# Patient Record
Sex: Female | Born: 1965 | Race: Black or African American | Hispanic: No | Marital: Married | Smoking: Never smoker
Health system: Southern US, Community
[De-identification: ages and names within clinical notes are randomized; demographics above are authoritative.]

## PROBLEM LIST (undated history)

## (undated) DIAGNOSIS — E119 Type 2 diabetes mellitus without complications: Secondary | ICD-10-CM

## (undated) DIAGNOSIS — T7840XA Allergy, unspecified, initial encounter: Secondary | ICD-10-CM

## (undated) DIAGNOSIS — I10 Essential (primary) hypertension: Secondary | ICD-10-CM

## (undated) DIAGNOSIS — E785 Hyperlipidemia, unspecified: Secondary | ICD-10-CM

## (undated) HISTORY — DX: Hyperlipidemia, unspecified: E78.5

## (undated) HISTORY — DX: Essential (primary) hypertension: I10

## (undated) HISTORY — DX: Allergy, unspecified, initial encounter: T78.40XA

## (undated) HISTORY — PX: REDUCTION MAMMAPLASTY: SUR839

## (undated) HISTORY — DX: Type 2 diabetes mellitus without complications: E11.9

---

## 2000-01-15 ENCOUNTER — Other Ambulatory Visit: Admission: RE | Admit: 2000-01-15 | Discharge: 2000-01-15 | Payer: Self-pay | Admitting: *Deleted

## 2000-05-02 ENCOUNTER — Ambulatory Visit (HOSPITAL_BASED_OUTPATIENT_CLINIC_OR_DEPARTMENT_OTHER): Admission: RE | Admit: 2000-05-02 | Discharge: 2000-05-03 | Payer: Self-pay | Admitting: Specialist

## 2000-12-15 ENCOUNTER — Encounter: Admission: RE | Admit: 2000-12-15 | Discharge: 2001-03-15 | Payer: Self-pay | Admitting: Internal Medicine

## 2003-10-19 HISTORY — PX: BREAST REDUCTION SURGERY: SHX8

## 2006-06-30 ENCOUNTER — Ambulatory Visit: Payer: Self-pay | Admitting: Nurse Practitioner

## 2006-07-01 ENCOUNTER — Ambulatory Visit: Payer: Self-pay | Admitting: *Deleted

## 2006-07-19 ENCOUNTER — Ambulatory Visit: Payer: Self-pay | Admitting: Nurse Practitioner

## 2006-07-26 ENCOUNTER — Ambulatory Visit (HOSPITAL_COMMUNITY): Admission: RE | Admit: 2006-07-26 | Discharge: 2006-07-26 | Payer: Self-pay | Admitting: Family Medicine

## 2006-08-03 ENCOUNTER — Ambulatory Visit (HOSPITAL_COMMUNITY): Admission: RE | Admit: 2006-08-03 | Discharge: 2006-08-03 | Payer: Self-pay | Admitting: Internal Medicine

## 2006-08-09 ENCOUNTER — Ambulatory Visit: Payer: Self-pay | Admitting: Nurse Practitioner

## 2006-11-21 ENCOUNTER — Ambulatory Visit: Payer: Self-pay | Admitting: Nurse Practitioner

## 2007-07-05 ENCOUNTER — Encounter (INDEPENDENT_AMBULATORY_CARE_PROVIDER_SITE_OTHER): Payer: Self-pay | Admitting: *Deleted

## 2010-11-08 ENCOUNTER — Encounter: Payer: Self-pay | Admitting: Family Medicine

## 2014-12-16 ENCOUNTER — Ambulatory Visit: Payer: Self-pay

## 2015-11-04 ENCOUNTER — Encounter: Payer: Self-pay | Admitting: Physician Assistant

## 2015-11-04 ENCOUNTER — Ambulatory Visit (INDEPENDENT_AMBULATORY_CARE_PROVIDER_SITE_OTHER): Payer: BLUE CROSS/BLUE SHIELD | Admitting: Physician Assistant

## 2015-11-04 VITALS — BP 142/71 | HR 87 | Ht 64.0 in | Wt 214.0 lb

## 2015-11-04 DIAGNOSIS — E1165 Type 2 diabetes mellitus with hyperglycemia: Secondary | ICD-10-CM | POA: Diagnosis not present

## 2015-11-04 DIAGNOSIS — R631 Polydipsia: Secondary | ICD-10-CM | POA: Diagnosis not present

## 2015-11-04 DIAGNOSIS — Z79899 Other long term (current) drug therapy: Secondary | ICD-10-CM

## 2015-11-04 DIAGNOSIS — L659 Nonscarring hair loss, unspecified: Secondary | ICD-10-CM

## 2015-11-04 DIAGNOSIS — R5383 Other fatigue: Secondary | ICD-10-CM

## 2015-11-04 DIAGNOSIS — Z0000 Encounter for general adult medical examination without abnormal findings: Secondary | ICD-10-CM

## 2015-11-04 DIAGNOSIS — Z23 Encounter for immunization: Secondary | ICD-10-CM | POA: Diagnosis not present

## 2015-11-04 DIAGNOSIS — E669 Obesity, unspecified: Secondary | ICD-10-CM | POA: Diagnosis not present

## 2015-11-04 DIAGNOSIS — Z1211 Encounter for screening for malignant neoplasm of colon: Secondary | ICD-10-CM

## 2015-11-04 DIAGNOSIS — Z13220 Encounter for screening for lipoid disorders: Secondary | ICD-10-CM | POA: Diagnosis not present

## 2015-11-04 DIAGNOSIS — Z1239 Encounter for other screening for malignant neoplasm of breast: Secondary | ICD-10-CM

## 2015-11-04 DIAGNOSIS — IMO0001 Reserved for inherently not codable concepts without codable children: Secondary | ICD-10-CM

## 2015-11-04 LAB — POCT GLYCOSYLATED HEMOGLOBIN (HGB A1C): Hemoglobin A1C: >14

## 2015-11-04 MED ORDER — METFORMIN HCL 1000 MG PO TABS: 1000.0000 mg | ORAL_TABLET | Freq: Two times a day (BID) | ORAL | Status: DC

## 2015-11-04 MED ORDER — INSULIN GLARGINE 300 UNIT/ML ~~LOC~~ SOPN: 10.0000 mg | PEN_INJECTOR | Freq: Every day | SUBCUTANEOUS | Status: DC

## 2015-11-04 MED ORDER — LIRAGLUTIDE 18 MG/3ML ~~LOC~~ SOPN: 1.8000 mg | PEN_INJECTOR | Freq: Every day | SUBCUTANEOUS | Status: DC

## 2015-11-04 MED ORDER — AMBULATORY NON FORMULARY MEDICATION: Status: DC

## 2015-11-04 MED ORDER — LISINOPRIL 20 MG PO TABS: 20.0000 mg | ORAL_TABLET | Freq: Every day | ORAL | Status: DC

## 2015-11-04 NOTE — Patient Instructions (Signed)
Diabetes and Exercise Exercising regularly is important. It is not just about losing weight. It has many health benefits, such as:  Improving your overall fitness, flexibility, and endurance.  Increasing your bone density.  Helping with weight control.  Decreasing your body fat.  Increasing your muscle strength.  Reducing stress and tension.  Improving your overall health. People with diabetes who exercise gain additional benefits because exercise:  Reduces appetite.  Improves the body's use of blood sugar (glucose).  Helps lower or control blood glucose.  Decreases blood pressure.  Helps control blood lipids (such as cholesterol and triglycerides).  Improves the body's use of the hormone insulin by:  Increasing the body's insulin sensitivity.  Reducing the body's insulin needs.  Decreases the risk for heart disease because exercising:  Lowers cholesterol and triglycerides levels.  Increases the levels of good cholesterol (such as high-density lipoproteins [HDL]) in the body.  Lowers blood glucose levels. YOUR ACTIVITY PLAN  Choose an activity that you enjoy, and set realistic goals. To exercise safely, you should begin practicing any new physical activity slowly, and gradually increase the intensity of the exercise over time. Your health care provider or diabetes educator can help create an activity plan that works for you. General recommendations include:  Encouraging children to engage in at least 60 minutes of physical activity each day.  Stretching and performing strength training exercises, such as yoga or weight lifting, at least 2 times per week.  Performing a total of at least 150 minutes of moderate-intensity exercise each week, such as brisk walking or water aerobics.  Exercising at least 3 days per week, making sure you allow no more than 2 consecutive days to pass without exercising.  Avoiding long periods of inactivity (90 minutes or more). When you  have to spend an extended period of time sitting down, take frequent breaks to walk or stretch. RECOMMENDATIONS FOR EXERCISING WITH TYPE 1 OR TYPE 2 DIABETES   Check your blood glucose before exercising. If blood glucose levels are greater than 240 mg/dL, check for urine ketones. Do not exercise if ketones are present.  Avoid injecting insulin into areas of the body that are going to be exercised. For example, avoid injecting insulin into:  The arms when playing tennis.  The legs when jogging.  Keep a record of:  Food intake before and after you exercise.  Expected peak times of insulin action.  Blood glucose levels before and after you exercise.  The type and amount of exercise you have done.  Review your records with your health care provider. Your health care provider will help you to develop guidelines for adjusting food intake and insulin amounts before and after exercising.  If you take insulin or oral hypoglycemic agents, watch for signs and symptoms of hypoglycemia. They include:  Dizziness.  Shaking.  Sweating.  Chills.  Confusion.  Drink plenty of water while you exercise to prevent dehydration or heat stroke. Body water is lost during exercise and must be replaced.  Talk to your health care provider before starting an exercise program to make sure it is safe for you. Remember, almost any type of activity is better than none.   This information is not intended to replace advice given to you by your health care provider. Make sure you discuss any questions you have with your health care provider.   Document Released: 12/25/2003 Document Revised: 02/18/2015 Document Reviewed: 03/13/2013 Elsevier Interactive Patient Education 2016 Elsevier Inc.  

## 2015-11-05 ENCOUNTER — Encounter: Payer: Self-pay | Admitting: Gastroenterology

## 2015-11-05 ENCOUNTER — Encounter: Payer: Self-pay | Admitting: Physician Assistant

## 2015-11-05 DIAGNOSIS — E785 Hyperlipidemia, unspecified: Secondary | ICD-10-CM | POA: Insufficient documentation

## 2015-11-05 DIAGNOSIS — IMO0002 Reserved for concepts with insufficient information to code with codable children: Secondary | ICD-10-CM | POA: Insufficient documentation

## 2015-11-05 DIAGNOSIS — R748 Abnormal levels of other serum enzymes: Secondary | ICD-10-CM | POA: Insufficient documentation

## 2015-11-05 DIAGNOSIS — E559 Vitamin D deficiency, unspecified: Secondary | ICD-10-CM | POA: Insufficient documentation

## 2015-11-05 DIAGNOSIS — E1165 Type 2 diabetes mellitus with hyperglycemia: Secondary | ICD-10-CM | POA: Insufficient documentation

## 2015-11-05 LAB — COMPLETE METABOLIC PANEL WITH GFR
ALBUMIN: 4 g/dL (ref 3.6–5.1)
ALK PHOS: 129 U/L (ref 33–130)
ALT: 34 U/L — ABNORMAL HIGH (ref 6–29)
AST: 41 U/L — ABNORMAL HIGH (ref 10–35)
BUN: 10 mg/dL (ref 7–25)
CALCIUM: 9.7 mg/dL (ref 8.6–10.4)
CO2: 24 mmol/L (ref 20–31)
Chloride: 102 mmol/L (ref 98–110)
Creat: 0.76 mg/dL (ref 0.50–1.05)
Glucose, Bld: 321 mg/dL — ABNORMAL HIGH (ref 65–99)
POTASSIUM: 4.3 mmol/L (ref 3.5–5.3)
Sodium: 135 mmol/L (ref 135–146)
Total Bilirubin: 0.4 mg/dL (ref 0.2–1.2)
Total Protein: 7.9 g/dL (ref 6.1–8.1)

## 2015-11-05 LAB — LIPID PANEL
CHOL/HDL RATIO: 3.1 ratio (ref <=5.0)
CHOLESTEROL: 212 mg/dL — AB (ref 125–200)
HDL: 68 mg/dL (ref >=46)
LDL Cholesterol: 127 mg/dL (ref <130)
Triglycerides: 84 mg/dL (ref <150)
VLDL: 17 mg/dL (ref <30)

## 2015-11-05 LAB — VITAMIN D 25 HYDROXY (VIT D DEFICIENCY, FRACTURES): Vit D, 25-Hydroxy: 16 ng/mL — ABNORMAL LOW (ref 30–100)

## 2015-11-05 LAB — VITAMIN B12: VITAMIN B 12: 729 pg/mL (ref 211–911)

## 2015-11-05 LAB — HEMOGLOBIN A1C
HEMOGLOBIN A1C: 14.2 % — AB (ref <5.7)
Mean Plasma Glucose: 361 mg/dL — ABNORMAL HIGH (ref <117)

## 2015-11-05 LAB — TSH: TSH: 1.953 u[IU]/mL (ref 0.350–4.500)

## 2015-11-06 ENCOUNTER — Other Ambulatory Visit: Payer: Self-pay | Admitting: Physician Assistant

## 2015-11-06 MED ORDER — ATORVASTATIN CALCIUM 40 MG PO TABS: 40.0000 mg | ORAL_TABLET | Freq: Every day | ORAL | Status: DC

## 2015-11-10 NOTE — Progress Notes (Signed)
   Subjective:    Patient ID: Alexandra [name], female    DOB: 07-10-1966, 50 y.o.   MRN: [id number]  HPI Pt is a 50 yo female who presents to the clinic to establish care.   .. Active Ambulatory Problems    Diagnosis Date Noted  . Diabetes type 2, uncontrolled ([facility]) 11/05/2015  . Hyperlipidemia LDL goal <100 11/05/2015  . Vitamin D deficiency 11/05/2015  . Elevated liver enzymes 11/05/2015   Resolved Ambulatory Problems    Diagnosis Date Noted  . No Resolved Ambulatory Problems   No Additional Past Medical History   .Marland Kitchen Family History  Problem Relation Age of Onset  . Cancer Mother     lymphoma  . Hypertension Mother   . Hypertension Father   . Diabetes Sister   . Hypertension Sister   . Thyroid disease Sister   . Alzheimer's disease Maternal Aunt   . Alcohol abuse Maternal Uncle   . Alcohol abuse Maternal Grandfather   . Heart attack Paternal Grandfather    .Marland Kitchen Social History   Social History  . Marital Status: Single    Spouse Name: N/A  . Number of Children: N/A  . Years of Education: N/A   Occupational History  . Not on file.   Social History Main Topics  . Smoking status: Never Smoker   . Smokeless tobacco: Not on file  . Alcohol Use: No  . Drug Use: No  . Sexual Activity: Yes   Other Topics Concern  . Not on file   Social History Narrative  . No narrative on file   Pt has not seen a health care provider in years due to no insurance.   She is having a lot of symptoms. She is having excessive thirst, no energy, hair loss. She is concerned she might have diabetes. No CP, palpitations, headaches or vision changes.    Review of Systems  All other systems reviewed and are negative.      Objective:   Physical Exam  Constitutional: She is oriented to person, place, and time. She appears well-developed and well-nourished.  HENT:  Head: Normocephalic and atraumatic.  Neck: Normal range of motion. Neck supple. No thyromegaly present.   Cardiovascular: Normal rate, regular rhythm and normal heart sounds.   Pulmonary/Chest: Effort normal and breath sounds normal. She has no wheezes.  Neurological: She is alert and oriented to person, place, and time.  Psychiatric: She has a normal mood and affect. Her behavior is normal.          Assessment & Plan:  Uncontrolled DM- A!C was over 14. Will add to blood work to confirm.  Started on toujeo today 10 units adding 2 units every 3 days until fasting glucose under 120.  Started victoza daily.  Started metformin bid.  Discussed how to use medications.  Given glucometer.  On ACE.  Follow up in 3 months.   HTN- started lisinopril once daily. Recheck in 3 months.   Obese- started on victoza so should help with weight loss. Pt is scheduling with nutritionist. Will follow up in 3 months and see if losing weight or need to add phentermine or another weight loss option.   No energy/hair loss excessive thirst- vitamin D, b12, TSH ordered. Will call with results.     Tdap given today. Mammogram ordered. Colonoscopy ordered. Fasting labs ordered.

## 2015-11-11 ENCOUNTER — Telehealth: Payer: Self-pay

## 2015-11-11 ENCOUNTER — Other Ambulatory Visit: Payer: Self-pay | Admitting: Physician Assistant

## 2015-11-11 MED ORDER — METFORMIN HCL ER 500 MG PO TB24: 500.0000 mg | ORAL_TABLET | Freq: Every day | ORAL | Status: DC

## 2015-11-11 NOTE — Telephone Encounter (Signed)
Sent to pharmacy. Will start with 500mg  and then may increase if tolerated.

## 2015-11-11 NOTE — Telephone Encounter (Signed)
Patient advised.

## 2015-11-11 NOTE — Telephone Encounter (Signed)
Orian called and states the metformin is making her sick to her stomach. She would like to try and switch to the once daily of metformin. Please advise.

## 2015-11-13 ENCOUNTER — Telehealth: Payer: Self-pay | Admitting: Physician Assistant

## 2015-11-13 NOTE — Telephone Encounter (Signed)
Received fax from [facility] and they have approved coverage on blood glucose strips. Reference # [id number] good from 11/13/2015 - 10/17/2038. - CF

## 2015-11-25 ENCOUNTER — Other Ambulatory Visit: Payer: Self-pay

## 2015-11-25 MED ORDER — LANCETS 30G MISC: Status: DC

## 2015-11-25 MED ORDER — BAYER CONTOUR NEXT MONITOR W/DEVICE KIT: PACK | Status: DC

## 2015-11-25 MED ORDER — GLUCOSE BLOOD VI STRP: ORAL_STRIP | Status: DC

## 2015-11-26 ENCOUNTER — Ambulatory Visit (INDEPENDENT_AMBULATORY_CARE_PROVIDER_SITE_OTHER): Payer: BLUE CROSS/BLUE SHIELD

## 2015-11-26 ENCOUNTER — Ambulatory Visit: Payer: BLUE CROSS/BLUE SHIELD

## 2015-11-26 DIAGNOSIS — Z1231 Encounter for screening mammogram for malignant neoplasm of breast: Secondary | ICD-10-CM

## 2015-11-26 DIAGNOSIS — Z1239 Encounter for other screening for malignant neoplasm of breast: Secondary | ICD-10-CM

## 2015-12-02 ENCOUNTER — Encounter: Payer: Self-pay | Admitting: Physician Assistant

## 2015-12-02 ENCOUNTER — Ambulatory Visit (INDEPENDENT_AMBULATORY_CARE_PROVIDER_SITE_OTHER): Payer: BLUE CROSS/BLUE SHIELD | Admitting: Physician Assistant

## 2015-12-02 VITALS — BP 134/88 | HR 79 | Ht 64.0 in | Wt 204.0 lb

## 2015-12-02 DIAGNOSIS — Z6831 Body mass index (BMI) 31.0-31.9, adult: Secondary | ICD-10-CM | POA: Insufficient documentation

## 2015-12-02 DIAGNOSIS — I10 Essential (primary) hypertension: Secondary | ICD-10-CM

## 2015-12-02 DIAGNOSIS — E669 Obesity, unspecified: Secondary | ICD-10-CM | POA: Diagnosis not present

## 2015-12-02 DIAGNOSIS — E1165 Type 2 diabetes mellitus with hyperglycemia: Secondary | ICD-10-CM | POA: Diagnosis not present

## 2015-12-02 DIAGNOSIS — Z23 Encounter for immunization: Secondary | ICD-10-CM

## 2015-12-02 DIAGNOSIS — Z794 Long term (current) use of insulin: Secondary | ICD-10-CM

## 2015-12-02 DIAGNOSIS — IMO0001 Reserved for inherently not codable concepts without codable children: Secondary | ICD-10-CM

## 2015-12-02 MED ORDER — AMBULATORY NON FORMULARY MEDICATION: Status: DC

## 2015-12-02 NOTE — Progress Notes (Signed)
   Subjective:    Patient ID: Alexandra [name], female    DOB: Mar 04, 1966, 50 y.o.   MRN: [id number]  HPI  Morning 16 units   Review of Systems     Objective:   Physical Exam        Assessment & Plan:

## 2015-12-02 NOTE — Progress Notes (Addendum)
   Subjective:    Patient ID: Alexandra [name], female    DOB: Nov 14, 1965, 50 y.o.   MRN: [id number]  HPI  Patient is a 50 year old female that presents for follow up for obesity, hypertension, and diabetes. Patient states that she is tolerating her pharmacotherapy regimen for diabetes well. Patient states that she feels much better now that her dosage of Metformin has been adjusted to 500mg  extended release. Patient states that her fasting blood glucose is 90-110. Patient has not received the pneumovax 23 vaccination. Patient is due for a foot examination. Patient states that she has not received an eye examination this year. Patient has lost 10 pounds since Jaunary 17,2017. Patient states that she has been tolerating Victoza well with minimal side effects but not at maximum dose currently. Patient states that her blood pressure has been well controlled with Lisinopril. No CP, palpitations, headaches, or vision changes.   Review of Systems    Please see HPI.  Objective:   Physical Exam  Constitutional: She is oriented to person, place, and time. She appears well-developed and well-nourished. No distress.  HENT:  Head: Normocephalic and atraumatic.  Nose: Nose normal.  Mouth/Throat: Oropharynx is clear and moist.  Patient's right tympanic membrane is sclerosed. Bony landmarks are visualized. Patient's left tympanic membrane is pearly and bony landmarks are visualized.   Eyes: Conjunctivae and EOM are normal.  Neck: Normal range of motion.  Cardiovascular: Normal rate, regular rhythm, normal heart sounds and intact distal pulses.   No murmur heard. Pulmonary/Chest: Effort normal and breath sounds normal. No respiratory distress. She has no wheezes. She has no rales. She exhibits no tenderness.  Abdominal: Soft. Bowel sounds are normal. She exhibits no distension and no mass. There is no tenderness. There is no rebound and no guarding.  Neurological: She is alert and oriented to person, place,  and time. She has normal reflexes.  Skin: Skin is warm and dry. She is not diaphoretic.  Psychiatric: She has a normal mood and affect. Her behavior is normal. Judgment and thought content normal.      Assessment & Plan:   1. Diabetes Type 2, uncontrolled. Patient's fasting blood sugars have ranged from 90-110. Patient was advised to reduce insulin glargine  by two units as long as her glucose remains in the aforementioned range and increase victoza to maximum dose of 1.8mg .Patient will follow up with hemoglobin [name] in two months. Patient underwent foot exam today, which revealed no findings of skin ulceration or neuropathy. Patient received pneumovax 23 in office today. Patient was reminded to follow up for eye examination. Patient states that she is tolerating her extended release Metformin (500mg ) well. Patient will continue with Metformin at this dose and release.   2. Essential Hypertension Patient's most recent blood pressure is 134/88. Patient reports that she is tolerating Lisinopril well.  Patient will continue taking Lisinopril (20mg ) to manage hypertension.   2. Obesity Patient will continue taking Victoza. Patient was advised to increase dose to 1.8mg .  Patient is tolerating medication well. Patient has lost 10 pounds since November 04, 2015. Patient requests a refill of Nova Fine 32 needles.

## 2016-01-01 ENCOUNTER — Other Ambulatory Visit: Payer: Self-pay | Admitting: Physician Assistant

## 2016-01-01 ENCOUNTER — Other Ambulatory Visit: Payer: Self-pay | Admitting: *Deleted

## 2016-01-01 MED ORDER — LISINOPRIL 20 MG PO TABS: 20.0000 mg | ORAL_TABLET | Freq: Every day | ORAL | Status: DC

## 2016-01-02 ENCOUNTER — Encounter: Payer: BLUE CROSS/BLUE SHIELD | Admitting: Gastroenterology

## 2016-01-02 NOTE — Telephone Encounter (Signed)
Notified patient.

## 2016-01-02 NOTE — Telephone Encounter (Signed)
Change pharmacy now so that next refill is up to date. If she wants her current refills transferred patient can do that herself and ask for prescription transfer.

## 2016-01-27 ENCOUNTER — Ambulatory Visit (AMBULATORY_SURGERY_CENTER): Payer: Self-pay | Admitting: *Deleted

## 2016-01-27 VITALS — Ht 64.0 in | Wt 193.0 lb

## 2016-01-27 DIAGNOSIS — Z1211 Encounter for screening for malignant neoplasm of colon: Secondary | ICD-10-CM

## 2016-01-27 NOTE — Progress Notes (Signed)
No egg or soy allergy known to patient  No issues with past sedation with any surgeries  or procedures, no intubation problems  No diet pills per patient No home 02 use per patient  No blood thinners per patient  Pt denies issues with constipation  emmi declined Pt has [employer] blue advantage and needs sample but we are out.  Sent staff message to [place] to call pt when we get samples. Instructed pt if we have not called her by 02-05-16 she needs to call us and we may have to change to miralax and gatorade over the counter . Pt verbalized understanding

## 2016-01-29 ENCOUNTER — Other Ambulatory Visit: Payer: Self-pay | Admitting: Physician Assistant

## 2016-02-02 ENCOUNTER — Ambulatory Visit: Payer: BLUE CROSS/BLUE SHIELD | Admitting: Physician Assistant

## 2016-02-03 ENCOUNTER — Ambulatory Visit (INDEPENDENT_AMBULATORY_CARE_PROVIDER_SITE_OTHER): Payer: BLUE CROSS/BLUE SHIELD | Admitting: Physician Assistant

## 2016-02-03 ENCOUNTER — Encounter: Payer: Self-pay | Admitting: Physician Assistant

## 2016-02-03 VITALS — BP 115/71 | HR 91 | Ht 64.0 in | Wt 187.0 lb

## 2016-02-03 DIAGNOSIS — E669 Obesity, unspecified: Secondary | ICD-10-CM | POA: Diagnosis not present

## 2016-02-03 DIAGNOSIS — R748 Abnormal levels of other serum enzymes: Secondary | ICD-10-CM

## 2016-02-03 DIAGNOSIS — Z794 Long term (current) use of insulin: Secondary | ICD-10-CM | POA: Diagnosis not present

## 2016-02-03 DIAGNOSIS — I10 Essential (primary) hypertension: Secondary | ICD-10-CM | POA: Diagnosis not present

## 2016-02-03 DIAGNOSIS — E1165 Type 2 diabetes mellitus with hyperglycemia: Secondary | ICD-10-CM

## 2016-02-03 DIAGNOSIS — E785 Hyperlipidemia, unspecified: Secondary | ICD-10-CM

## 2016-02-03 DIAGNOSIS — IMO0001 Reserved for inherently not codable concepts without codable children: Secondary | ICD-10-CM

## 2016-02-03 LAB — POCT UA - MICROALBUMIN
Creatinine, POC: 300 mg/dL
Microalbumin Ur, POC: 80 mg/L

## 2016-02-03 LAB — POCT GLYCOSYLATED HEMOGLOBIN (HGB A1C): HEMOGLOBIN A1C: 8

## 2016-02-03 MED ORDER — METFORMIN HCL ER 500 MG PO TB24: 500.0000 mg | ORAL_TABLET | Freq: Every day | ORAL | Status: DC

## 2016-02-03 MED ORDER — LISINOPRIL 20 MG PO TABS: 20.0000 mg | ORAL_TABLET | Freq: Every day | ORAL | Status: DC

## 2016-02-03 MED ORDER — LIRAGLUTIDE 18 MG/3ML ~~LOC~~ SOPN: 1.8000 mg | PEN_INJECTOR | Freq: Every day | SUBCUTANEOUS | Status: DC

## 2016-02-03 MED ORDER — INSULIN GLARGINE 300 UNIT/ML ~~LOC~~ SOPN: 5.0000 mg | PEN_INJECTOR | Freq: Every day | SUBCUTANEOUS | Status: DC

## 2016-02-03 NOTE — Progress Notes (Signed)
   Subjective:    Patient ID: Alexandra [name], female    DOB: Nov 05, 1965, 50 y.o.   MRN: [id number]  HPI Pt is a here today for 3 month DM follow up. She is checking her fasting glucose and running 87-135's. She has had one or two hypoglycemic events but has not checked sugar but eaten something and symtpoms resolved. She is down to 5units of toujeo at bedtime. She denies any increased thirst, open wounds or sores. She is taking metformin and victoza daily as well. She has made a lot of diet changes and lost 27lbs in 3 months. She feels so much better. Denies any CP, palpitations, headaches, SOB. On lisinopril for HTN. She did not start lipitor. She is concerned about side effects.   Review of Systems  All other systems reviewed and are negative.      Objective:   Physical Exam  Constitutional: She is oriented to person, place, and time. She appears well-developed and well-nourished.  Obese.   HENT:  Head: Normocephalic and atraumatic.  Cardiovascular: Normal rate, regular rhythm and normal heart sounds.   Neurological: She is alert and oriented to person, place, and time.  Skin: Skin is dry.  Psychiatric: She has a normal mood and affect. Her behavior is normal.          Assessment & Plan:  DM type II- A!C down from greater than 14 to 8.0 . .. Results for orders placed or performed in visit on 02/03/16  POCT HgB [name]  Result Value Ref Range   Hemoglobin [name] 8.0   POCT UA - Microalbumin  Result Value Ref Range   Microalbumin Ur, POC 80 mg/L   Creatinine, POC 300 mg/dL   Albumin/Creatinine Ratio, Urine, POC 30-300    mircoalbumin detected. On ACE.  Will get eye exam.  Refilled victoza, metformin and toujeo.  Follow up in 3 months.   Obese- lost 27lbs. Amazing job. Keep up good work.   HTN- controlled. Refilled lisinopril.   Hyperlipidemia- pt concerned about side effects of lipitor. Reassured pt;however, since lost 27lbs would like to wait 3 months and recheck at that  time. Pt aware of LDL goals and increased risk factors of cardiac events.   Elevated liver enzymes- will recheck with cmp today.

## 2016-02-04 LAB — COMPLETE METABOLIC PANEL WITH GFR
ALT: 18 U/L (ref 6–29)
AST: 21 U/L (ref 10–35)
Albumin: 4.5 g/dL (ref 3.6–5.1)
Alkaline Phosphatase: 102 U/L (ref 33–130)
BUN: 11 mg/dL (ref 7–25)
CHLORIDE: 103 mmol/L (ref 98–110)
CO2: 26 mmol/L (ref 20–31)
CREATININE: 0.85 mg/dL (ref 0.50–1.05)
Calcium: 9.7 mg/dL (ref 8.6–10.4)
GFR, EST NON AFRICAN AMERICAN: 80 mL/min (ref >=60)
GLUCOSE: 98 mg/dL (ref 65–99)
Potassium: 4.2 mmol/L (ref 3.5–5.3)
SODIUM: 139 mmol/L (ref 135–146)
TOTAL PROTEIN: 8.4 g/dL — AB (ref 6.1–8.1)
Total Bilirubin: 0.6 mg/dL (ref 0.2–1.2)

## 2016-02-10 ENCOUNTER — Encounter: Payer: Self-pay | Admitting: Gastroenterology

## 2016-02-10 ENCOUNTER — Ambulatory Visit (AMBULATORY_SURGERY_CENTER): Payer: BLUE CROSS/BLUE SHIELD | Admitting: Gastroenterology

## 2016-02-10 VITALS — BP 112/72 | HR 81 | Temp 98.6°F | Resp 12 | Ht 64.0 in | Wt 193.0 lb

## 2016-02-10 DIAGNOSIS — Z1211 Encounter for screening for malignant neoplasm of colon: Secondary | ICD-10-CM | POA: Diagnosis present

## 2016-02-10 MED ORDER — DEXTROSE 5 % IV SOLN: Freq: Once | INTRAVENOUS | Status: DC

## 2016-02-10 MED ORDER — SODIUM CHLORIDE 0.9 % IV SOLN
500.0000 mL | INTRAVENOUS | Status: DC
  Administered 2016-02-10: 500 mL via INTRAVENOUS

## 2016-02-10 NOTE — Progress Notes (Signed)
To recovery, report to Brown, RN, VSS. 

## 2016-02-10 NOTE — Op Note (Signed)
[facility] Patient Name: Alexandra [name] Procedure Date: 02/10/2016 10:34 AM MRN: [id number] Endoscopist: Mauri Pole , MD Age: 50 Date of Birth: [birth year] Gender: Female Procedure:                Colonoscopy Indications:              Screening for colorectal malignant neoplasm Medicines:                Monitored Anesthesia Care Procedure:                Pre-Anesthesia Assessment:                           - Prior to the procedure, a History and Physical                            was performed, and patient medications and                            allergies were reviewed. The patient's tolerance of                            previous anesthesia was also reviewed. The risks                            and benefits of the procedure and the sedation                            options and risks were discussed with the patient.                            All questions were answered, and informed consent                            was obtained. Prior Anticoagulants: The patient has                            taken no previous anticoagulant or antiplatelet                            agents. ASA Grade Assessment: II - A patient with                            mild systemic disease. After reviewing the risks                            and benefits, the patient was deemed in                            satisfactory condition to undergo the procedure.                           After obtaining informed consent, the colonoscope  was passed under direct vision. Throughout the                            procedure, the patient's blood pressure, pulse, and                            oxygen saturations were monitored continuously. The                            Model CF-HQ190L [id number]) scope was introduced                            through the anus and advanced to the the terminal                            ileum, with identification of the appendiceal                           orifice and IC valve. The colonoscopy was performed                            without difficulty. The patient tolerated the                            procedure well. The quality of the bowel                            preparation was adequate to identify polyps 6 mm                            and larger in size. The ileocecal valve,                            appendiceal orifice, and rectum were photographed. Scope In: 10:50:18 AM Scope Out: 11:02:51 AM Scope Withdrawal Time: 0 hours 9 minutes 29 seconds  Total Procedure Duration: 0 hours 12 minutes 33 seconds  Findings:                 The perianal and digital rectal examinations were                            normal.                           Non-bleeding internal hemorrhoids were found during                            retroflexion. The hemorrhoids were small. Complications:            No immediate complications. Estimated Blood Loss:     Estimated blood loss: none. Impression:               - Non-bleeding internal hemorrhoids.                           - No specimens collected. Recommendation:           -  Patient has a contact number available for                            emergencies. The signs and symptoms of potential                            delayed complications were discussed with the                            patient. Return to normal activities tomorrow.                            Written discharge instructions were provided to the                            patient.                           - Resume previous diet.                           - Continue present medications.                           - Repeat colonoscopy in 10 years for screening                            purposes.                           - Return to GI clinic PRN. Mauri Pole, MD 02/10/2016 11:12:04 AM This report has been signed electronically.

## 2016-02-10 NOTE — Patient Instructions (Signed)
YOU HAD AN ENDOSCOPIC PROCEDURE TODAY AT [facility]:   Refer to the procedure report that was given to you for any specific questions about what was found during the examination.  If the procedure report does not answer your questions, please call your gastroenterologist to clarify.  If you requested that your care partner not be given the details of your procedure findings, then the procedure report has been included in a sealed envelope for you to review at your convenience later.  YOU SHOULD EXPECT: Some feelings of bloating in the abdomen. Passage of more gas than usual.  Walking can help get rid of the air that was put into your GI tract during the procedure and reduce the bloating. If you had a lower endoscopy (such as a colonoscopy or flexible sigmoidoscopy) you may notice spotting of blood in your stool or on the toilet paper. If you underwent a bowel prep for your procedure, you may not have a normal bowel movement for a few days.  Please Note:  You might notice some irritation and congestion in your nose or some drainage.  This is from the oxygen used during your procedure.  There is no need for concern and it should clear up in a day or so.  SYMPTOMS TO REPORT IMMEDIATELY:   Following lower endoscopy (colonoscopy or flexible sigmoidoscopy):  Excessive amounts of blood in the stool  Significant tenderness or worsening of abdominal pains  Swelling of the abdomen that is new, acute  Fever of 100F or higher    For urgent or emergent issues, a gastroenterologist can be reached at any hour by calling [phone].   DIET: Your first meal following the procedure should be a small meal and then it is ok to progress to your normal diet. Heavy or fried foods are harder to digest and may make you feel nauseous or bloated.  Likewise, meals heavy in dairy and vegetables can increase bloating.  Drink plenty of fluids but you should avoid alcoholic beverages for 24  hours.  ACTIVITY:  You should plan to take it easy for the rest of today and you should NOT DRIVE or use heavy machinery until tomorrow (because of the sedation medicines used during the test).    FOLLOW UP: Our staff will call the number listed on your records the next business day following your procedure to check on you and address any questions or concerns that you may have regarding the information given to you following your procedure. If we do not reach you, we will leave a message.  However, if you are feeling well and you are not experiencing any problems, there is no need to return our call.  We will assume that you have returned to your regular daily activities without incident.  If any biopsies were taken you will be contacted by phone or by letter within the next 1-3 weeks.  Please call us at [phone] if you have not heard about the biopsies in 3 weeks.    SIGNATURES/CONFIDENTIALITY: You and/or your care partner have signed paperwork which will be entered into your electronic medical record.  These signatures attest to the fact that that the information above on your After Visit Summary has been reviewed and is understood.  Full responsibility of the confidentiality of this discharge information lies with you and/or your care-partner.   Resume medications. Information given on hemorrhoids,banding and high fiber diet.

## 2016-02-11 ENCOUNTER — Telehealth: Payer: Self-pay | Admitting: *Deleted

## 2016-02-11 NOTE — Telephone Encounter (Signed)
  Follow up Call-  Call back number 02/10/2016  Post procedure Call Back phone  # [phone]  Permission to leave phone message Yes     Patient questions:  Message left to call us if necessary.

## 2016-02-12 ENCOUNTER — Telehealth: Payer: Self-pay | Admitting: Gastroenterology

## 2016-02-12 NOTE — Telephone Encounter (Signed)
I have left message for the patient to call back 

## 2016-04-13 ENCOUNTER — Other Ambulatory Visit: Payer: Self-pay | Admitting: *Deleted

## 2016-04-13 MED ORDER — METFORMIN HCL ER 500 MG PO TB24: 500.0000 mg | ORAL_TABLET | Freq: Every day | ORAL | Status: DC

## 2016-04-26 ENCOUNTER — Other Ambulatory Visit: Payer: Self-pay | Admitting: Physician Assistant

## 2016-05-05 ENCOUNTER — Ambulatory Visit: Payer: BLUE CROSS/BLUE SHIELD | Admitting: Physician Assistant

## 2016-05-19 ENCOUNTER — Ambulatory Visit (INDEPENDENT_AMBULATORY_CARE_PROVIDER_SITE_OTHER): Payer: BLUE CROSS/BLUE SHIELD | Admitting: Physician Assistant

## 2016-05-19 ENCOUNTER — Encounter: Payer: Self-pay | Admitting: Physician Assistant

## 2016-05-19 ENCOUNTER — Other Ambulatory Visit (HOSPITAL_COMMUNITY)
Admission: RE | Admit: 2016-05-19 | Discharge: 2016-05-19 | Disposition: A | Discharge status: Home / Self Care | Payer: BLUE CROSS/BLUE SHIELD | Source: Ambulatory Visit | Attending: Physician Assistant | Admitting: Physician Assistant

## 2016-05-19 VITALS — BP 112/74 | HR 96 | Ht 64.0 in | Wt 178.0 lb

## 2016-05-19 DIAGNOSIS — Z794 Long term (current) use of insulin: Secondary | ICD-10-CM

## 2016-05-19 DIAGNOSIS — Z1151 Encounter for screening for human papillomavirus (HPV): Secondary | ICD-10-CM | POA: Diagnosis present

## 2016-05-19 DIAGNOSIS — Z01419 Encounter for gynecological examination (general) (routine) without abnormal findings: Secondary | ICD-10-CM | POA: Diagnosis not present

## 2016-05-19 DIAGNOSIS — E785 Hyperlipidemia, unspecified: Secondary | ICD-10-CM

## 2016-05-19 DIAGNOSIS — E559 Vitamin D deficiency, unspecified: Secondary | ICD-10-CM

## 2016-05-19 DIAGNOSIS — E119 Type 2 diabetes mellitus without complications: Secondary | ICD-10-CM

## 2016-05-19 LAB — POCT GLYCOSYLATED HEMOGLOBIN (HGB A1C): HEMOGLOBIN A1C: 6

## 2016-05-19 MED ORDER — METFORMIN HCL ER 500 MG PO TB24: 500.0000 mg | ORAL_TABLET | Freq: Every day | ORAL | 0 refills | Status: DC

## 2016-05-19 MED ORDER — LIRAGLUTIDE 18 MG/3ML ~~LOC~~ SOPN: 1.8000 mg | PEN_INJECTOR | Freq: Every day | SUBCUTANEOUS | 0 refills | Status: DC

## 2016-05-19 NOTE — Progress Notes (Addendum)
   Subjective:    Patient ID: Alexandra [name], female    DOB: 1966-04-27, 50 y.o.   MRN: [id number]  HPI Patient is a 50 year old female that presents to clinic for follow of Type 2 DM and for a pap smear. She has continued to stay committed to diet modifications and has lost 26 pounds since first getting diagnosis approximately one year ago. She takes a daily multivitamin. She plans on getting an eye exam this year. Fasting blood glucose usually run around 90-95. She is currently taking Toujeo 2 units at night daily, Victoza 1.8 mg daily, and metformin 500 mg XR daily. She does not complain of medication side effects. She denies paraesthesias, unhealing wounds, polyuria, polydipsia, or episodes of hypoglycemia.   She also requests a pap smear be done today. She denies vaginal discharge, dysuria, abdominal/pelvic pain    Review of Systems  All other systems reviewed and are negative.      Objective:   Physical Exam  Constitutional: She is oriented to person, place, and time. She appears well-developed and well-nourished.  HENT:  Head: Normocephalic and atraumatic.  Cardiovascular: Normal rate, regular rhythm and normal heart sounds.   Pulmonary/Chest: Effort normal and breath sounds normal.  Abdominal: Soft. Bowel sounds are normal.  Genitourinary: Vagina normal.    Genitourinary Comments: No active bleeding. Pap smear collected today  Neurological: She is alert and oriented to person, place, and time.  Skin: Skin is warm and dry.  Psychiatric: She has a normal mood and affect. Her behavior is normal.      Assessment & Plan:  DM, type II, controlled- HA1c 6 today in clinic Encouraged continued diet modifications and exercise Stop Toujeo  Continue with Victoza 1.8 mg daily and Metformin XR 500 mg daily Check CMP Reminded pt to get eye exam.  Follow up in 3 months.  Hyperlipidemia-pt never started statin that was prescribed.  Previous LDL 127 and total cholesterol 214 Recheck  lipid panel Start statin if LDL > 100.   Vitamin D Deficiency- Previous vitamin D 17  Recheck vitamin D.  Start Vitamin D 2000 IU daily if Vitamin D < 30  Preventative- Pap collected. Cyst noted on exam. Reassured patient of benign nature. Declined STI testing.

## 2016-05-20 LAB — COMPLETE METABOLIC PANEL WITH GFR
ALT: 12 U/L (ref 6–29)
AST: 15 U/L (ref 10–35)
Albumin: 4.3 g/dL (ref 3.6–5.1)
Alkaline Phosphatase: 86 U/L (ref 33–130)
BUN: 15 mg/dL (ref 7–25)
CALCIUM: 9.9 mg/dL (ref 8.6–10.4)
CHLORIDE: 104 mmol/L (ref 98–110)
CO2: 25 mmol/L (ref 20–31)
Creat: 0.85 mg/dL (ref 0.50–1.05)
GFR, Est African American: >89 mL/min (ref >=60)
GFR, Est Non African American: 80 mL/min (ref >=60)
GLUCOSE: 91 mg/dL (ref 65–99)
POTASSIUM: 4.6 mmol/L (ref 3.5–5.3)
SODIUM: 140 mmol/L (ref 135–146)
Total Bilirubin: 0.4 mg/dL (ref 0.2–1.2)
Total Protein: 7.9 g/dL (ref 6.1–8.1)

## 2016-05-20 LAB — LIPID PANEL
Cholesterol: 196 mg/dL (ref 125–200)
HDL: 77 mg/dL (ref >=46)
LDL CALC: 107 mg/dL (ref <130)
TRIGLYCERIDES: 59 mg/dL (ref <150)
Total CHOL/HDL Ratio: 2.5 Ratio (ref <=5.0)
VLDL: 12 mg/dL (ref <30)

## 2016-05-20 LAB — VITAMIN D 25 HYDROXY (VIT D DEFICIENCY, FRACTURES): Vit D, 25-Hydroxy: 34 ng/mL (ref 30–100)

## 2016-05-21 LAB — CYTOLOGY - PAP

## 2016-07-21 ENCOUNTER — Other Ambulatory Visit: Payer: Self-pay | Admitting: Physician Assistant

## 2016-08-16 ENCOUNTER — Other Ambulatory Visit: Payer: Self-pay | Admitting: Physician Assistant

## 2016-08-20 ENCOUNTER — Ambulatory Visit: Payer: BLUE CROSS/BLUE SHIELD | Admitting: Physician Assistant

## 2016-09-06 ENCOUNTER — Ambulatory Visit: Payer: BLUE CROSS/BLUE SHIELD | Admitting: Physician Assistant

## 2016-09-28 ENCOUNTER — Ambulatory Visit: Payer: BLUE CROSS/BLUE SHIELD | Admitting: Physician Assistant

## 2016-09-29 ENCOUNTER — Ambulatory Visit: Payer: BLUE CROSS/BLUE SHIELD | Admitting: Physician Assistant

## 2016-10-07 ENCOUNTER — Other Ambulatory Visit: Payer: Self-pay | Admitting: Physician Assistant

## 2016-10-08 ENCOUNTER — Other Ambulatory Visit: Payer: Self-pay | Admitting: Physician Assistant

## 2016-11-30 ENCOUNTER — Telehealth: Payer: Self-pay | Admitting: Physician Assistant

## 2016-11-30 NOTE — Telephone Encounter (Signed)
Patient Declined flu shot 11/30/16 @ 4:28pm

## 2016-11-30 NOTE — Telephone Encounter (Signed)
I left a voicemail asking pt to call us back so we can update her chart if she had the flu shot or not this year or if she was not wanting one

## 2016-12-16 ENCOUNTER — Other Ambulatory Visit: Payer: Self-pay | Admitting: Physician Assistant

## 2017-01-05 ENCOUNTER — Other Ambulatory Visit: Payer: Self-pay | Admitting: Physician Assistant

## 2017-01-07 ENCOUNTER — Other Ambulatory Visit: Payer: Self-pay | Admitting: *Deleted

## 2017-01-07 ENCOUNTER — Other Ambulatory Visit: Payer: Self-pay | Admitting: Physician Assistant

## 2017-01-07 MED ORDER — METFORMIN HCL ER 500 MG PO TB24: 500.0000 mg | ORAL_TABLET | Freq: Every day | ORAL | 0 refills | Status: DC

## 2017-01-12 ENCOUNTER — Ambulatory Visit (INDEPENDENT_AMBULATORY_CARE_PROVIDER_SITE_OTHER): Payer: BLUE CROSS/BLUE SHIELD | Admitting: Physician Assistant

## 2017-01-12 ENCOUNTER — Encounter: Payer: Self-pay | Admitting: Physician Assistant

## 2017-01-12 VITALS — BP 121/82 | HR 94 | Ht 64.0 in | Wt 197.0 lb

## 2017-01-12 DIAGNOSIS — IMO0001 Reserved for inherently not codable concepts without codable children: Secondary | ICD-10-CM

## 2017-01-12 DIAGNOSIS — E1165 Type 2 diabetes mellitus with hyperglycemia: Secondary | ICD-10-CM

## 2017-01-12 LAB — POCT GLYCOSYLATED HEMOGLOBIN (HGB A1C): Hemoglobin A1C: 7.7

## 2017-01-12 MED ORDER — DAPAGLIFLOZIN PRO-METFORMIN ER 10-1000 MG PO TB24: 1.0000 | ORAL_TABLET | Freq: Every day | ORAL | 2 refills | Status: DC

## 2017-01-12 NOTE — Progress Notes (Signed)
   Subjective:    Patient ID: Alexandra [name], female    DOB: 1965/11/19, 51 y.o.   MRN: [id number]  HPI Pt is a 51 yo female who presents to the clinic  Eye exam need.    Review of Systems  All other systems reviewed and are negative.      Objective:   Physical Exam  Constitutional: She is oriented to person, place, and time. She appears well-developed and well-nourished.  HENT:  Head: Normocephalic and atraumatic.  Cardiovascular: Normal rate, regular rhythm and normal heart sounds.   Pulmonary/Chest: Effort normal and breath sounds normal.  Neurological: She is alert and oriented to person, place, and time.  Psychiatric: She has a normal mood and affect. Her behavior is normal.          Assessment & Plan:  Marland KitchenMarland KitchenVelvilee was seen today for diabetes.  Diagnoses and all orders for this visit:  Uncontrolled type 2 diabetes mellitus without complication, without long-term current use of insulin (HCC) -     POCT HgB [name] -     Dapagliflozin-Metformin HCl ER (XIGDUO XR) 07-999 MG TB24; Take 1 tablet by mouth daily. -     liraglutide (VICTOZA) 18 MG/3ML SOPN; Inject 0.3 mLs (1.8 mg total) into the skin daily.  .. Lab Results  Component Value Date   HGBA1C 7.7 01/12/2017    Added xigduo. Discussed side effects. Follow up in 3 months.  NEEDS EYE EXAM.  Discussed diet and exercise.    Marland KitchenMarland Kitchen

## 2017-01-14 ENCOUNTER — Encounter: Payer: Self-pay | Admitting: Physician Assistant

## 2017-01-14 MED ORDER — LIRAGLUTIDE 18 MG/3ML ~~LOC~~ SOPN: 1.8000 mg | PEN_INJECTOR | Freq: Every day | SUBCUTANEOUS | 2 refills | Status: DC

## 2017-01-20 ENCOUNTER — Other Ambulatory Visit: Payer: Self-pay | Admitting: Physician Assistant

## 2017-02-04 ENCOUNTER — Other Ambulatory Visit: Payer: Self-pay | Admitting: Physician Assistant

## 2017-02-04 ENCOUNTER — Telehealth: Payer: Self-pay | Admitting: *Deleted

## 2017-02-04 MED ORDER — CANAGLIFLOZIN-METFORMIN HCL 150-1000 MG PO TABS: ORAL_TABLET | ORAL | 2 refills | Status: DC

## 2017-02-04 NOTE — Telephone Encounter (Signed)
Pt left vm stating that her ins won't cover the Xigduo and she was wondering if you could send her something else in comparable to that.

## 2017-02-04 NOTE — Progress Notes (Signed)
Switched xigduo to [employer].

## 2017-03-28 ENCOUNTER — Other Ambulatory Visit: Payer: Self-pay | Admitting: Physician Assistant

## 2017-04-10 ENCOUNTER — Other Ambulatory Visit: Payer: Self-pay | Admitting: Family Medicine

## 2017-04-15 ENCOUNTER — Ambulatory Visit: Payer: BLUE CROSS/BLUE SHIELD | Admitting: Physician Assistant

## 2017-04-15 ENCOUNTER — Other Ambulatory Visit: Payer: Self-pay | Admitting: Family Medicine

## 2017-04-18 ENCOUNTER — Ambulatory Visit: Payer: BLUE CROSS/BLUE SHIELD | Admitting: Physician Assistant

## 2017-05-09 ENCOUNTER — Ambulatory Visit: Payer: BLUE CROSS/BLUE SHIELD | Admitting: Physician Assistant

## 2017-05-09 DIAGNOSIS — Z0189 Encounter for other specified special examinations: Secondary | ICD-10-CM

## 2018-11-08 ENCOUNTER — Encounter: Payer: BLUE CROSS/BLUE SHIELD | Admitting: Physician Assistant

## 2019-02-01 ENCOUNTER — Telehealth: Payer: Self-pay

## 2019-02-01 NOTE — Telephone Encounter (Signed)
Per patient in January 2020, Alexandra [name] is no longer in network and cannot be seen in our office.   Alexandra [name] has been removed from PCP in pt's chart

## 2020-03-11 ENCOUNTER — Encounter: Payer: Self-pay | Admitting: Physician Assistant

## 2020-03-11 ENCOUNTER — Ambulatory Visit (INDEPENDENT_AMBULATORY_CARE_PROVIDER_SITE_OTHER): Payer: 59 | Admitting: Physician Assistant

## 2020-03-11 VITALS — BP 140/86 | HR 76 | Ht 64.0 in | Wt 203.0 lb

## 2020-03-11 DIAGNOSIS — E1165 Type 2 diabetes mellitus with hyperglycemia: Secondary | ICD-10-CM

## 2020-03-11 DIAGNOSIS — R5383 Other fatigue: Secondary | ICD-10-CM

## 2020-03-11 DIAGNOSIS — I10 Essential (primary) hypertension: Secondary | ICD-10-CM | POA: Diagnosis not present

## 2020-03-11 DIAGNOSIS — Z1231 Encounter for screening mammogram for malignant neoplasm of breast: Secondary | ICD-10-CM | POA: Diagnosis not present

## 2020-03-11 DIAGNOSIS — Z13220 Encounter for screening for lipoid disorders: Secondary | ICD-10-CM

## 2020-03-11 DIAGNOSIS — R718 Other abnormality of red blood cells: Secondary | ICD-10-CM

## 2020-03-11 LAB — POCT GLYCOSYLATED HEMOGLOBIN (HGB A1C): Hemoglobin A1C: 14 % — AB (ref 4.0–5.6)

## 2020-03-11 MED ORDER — XIGDUO XR 10-1000 MG PO TB24: 1.0000 | ORAL_TABLET | Freq: Every day | ORAL | 2 refills | Status: DC

## 2020-03-11 MED ORDER — TOUJEO MAX SOLOSTAR 300 UNIT/ML ~~LOC~~ SOPN: 10.0000 [IU] | PEN_INJECTOR | Freq: Every day | SUBCUTANEOUS | 1 refills | Status: DC

## 2020-03-11 MED ORDER — OZEMPIC (0.25 OR 0.5 MG/DOSE) 2 MG/1.5ML ~~LOC~~ SOPN: 0.5000 mg | PEN_INJECTOR | SUBCUTANEOUS | 2 refills | Status: DC

## 2020-03-11 MED ORDER — LISINOPRIL 10 MG PO TABS: 10.0000 mg | ORAL_TABLET | Freq: Every day | ORAL | 3 refills | Status: DC

## 2020-03-11 NOTE — Patient Instructions (Addendum)
Need eye exam.  Order placed for mammogram.  Get labs.  toujeo start with 10 units at bedtime.  Follow up in 3 months.    Diabetes Mellitus and Nutrition, Adult When you have diabetes (diabetes mellitus), it is very important to have healthy eating habits because your blood sugar (glucose) levels are greatly affected by what you eat and drink. Eating healthy foods in the appropriate amounts, at about the same times every day, can help you:  Control your blood glucose.  Lower your risk of heart disease.  Improve your blood pressure.  Reach or maintain a healthy weight. Every person with diabetes is different, and each person has different needs for a meal plan. Your health care provider may recommend that you work with a diet and nutrition specialist (dietitian) to make a meal plan that is best for you. Your meal plan may vary depending on factors such as:  The calories you need.  The medicines you take.  Your weight.  Your blood glucose, blood pressure, and cholesterol levels.  Your activity level.  Other health conditions you have, such as heart or kidney disease. How do carbohydrates affect me? Carbohydrates, also called carbs, affect your blood glucose level more than any other type of food. Eating carbs naturally raises the amount of glucose in your blood. Carb counting is a method for keeping track of how many carbs you eat. Counting carbs is important to keep your blood glucose at a healthy level, especially if you use insulin or take certain oral diabetes medicines. It is important to know how many carbs you can safely have in each meal. This is different for every person. Your dietitian can help you calculate how many carbs you should have at each meal and for each snack. Foods that contain carbs include:  Bread, cereal, rice, pasta, and crackers.  Potatoes and corn.  Peas, beans, and lentils.  Milk and yogurt.  Fruit and juice.  Desserts, such as cakes, cookies,  ice cream, and candy. How does alcohol affect me? Alcohol can cause a sudden decrease in blood glucose (hypoglycemia), especially if you use insulin or take certain oral diabetes medicines. Hypoglycemia can be a life-threatening condition. Symptoms of hypoglycemia (sleepiness, dizziness, and confusion) are similar to symptoms of having too much alcohol. If your health care provider says that alcohol is safe for you, follow these guidelines:  Limit alcohol intake to no more than 1 drink per day for nonpregnant women and 2 drinks per day for men. One drink equals 12 oz of beer, 5 oz of wine, or 1 oz of hard liquor.  Do not drink on an empty stomach.  Keep yourself hydrated with water, diet soda, or unsweetened iced tea.  Keep in mind that regular soda, juice, and other mixers may contain a lot of sugar and must be counted as carbs. What are tips for following this plan?  Reading food labels  Start by checking the serving size on the "Nutrition Facts" label of packaged foods and drinks. The amount of calories, carbs, fats, and other nutrients listed on the label is based on one serving of the item. Many items contain more than one serving per package.  Check the total grams (g) of carbs in one serving. You can calculate the number of servings of carbs in one serving by dividing the total carbs by 15. For example, if a food has 30 g of total carbs, it would be equal to 2 servings of carbs.  Check the  number of grams (g) of saturated and trans fats in one serving. Choose foods that have low or no amount of these fats.  Check the number of milligrams (mg) of salt (sodium) in one serving. Most people should limit total sodium intake to less than 2,300 mg per day.  Always check the nutrition information of foods labeled as "low-fat" or "nonfat". These foods may be higher in added sugar or refined carbs and should be avoided.  Talk to your dietitian to identify your daily goals for nutrients listed  on the label. Shopping  Avoid buying canned, premade, or processed foods. These foods tend to be high in fat, sodium, and added sugar.  Shop around the outside edge of the grocery store. This includes fresh fruits and vegetables, bulk grains, fresh meats, and fresh dairy. Cooking  Use low-heat cooking methods, such as baking, instead of high-heat cooking methods like deep frying.  Cook using healthy oils, such as olive, canola, or sunflower oil.  Avoid cooking with butter, cream, or high-fat meats. Meal planning  Eat meals and snacks regularly, preferably at the same times every day. Avoid going long periods of time without eating.  Eat foods high in fiber, such as fresh fruits, vegetables, beans, and whole grains. Talk to your dietitian about how many servings of carbs you can eat at each meal.  Eat 4-6 ounces (oz) of lean protein each day, such as lean meat, chicken, fish, eggs, or tofu. One oz of lean protein is equal to: ? 1 oz of meat, chicken, or fish. ? 1 egg. ?  cup of tofu.  Eat some foods each day that contain healthy fats, such as avocado, nuts, seeds, and fish. Lifestyle  Check your blood glucose regularly.  Exercise regularly as told by your health care provider. This may include: ? 150 minutes of moderate-intensity or vigorous-intensity exercise each week. This could be brisk walking, biking, or water aerobics. ? Stretching and doing strength exercises, such as yoga or weightlifting, at least 2 times a week.  Take medicines as told by your health care provider.  Do not use any products that contain nicotine or tobacco, such as cigarettes and e-cigarettes. If you need help quitting, ask your health care provider.  Work with a Social worker or diabetes educator to identify strategies to manage stress and any emotional and social challenges. Questions to ask a health care provider  Do I need to meet with a diabetes educator?  Do I need to meet with a  dietitian?  What number can I call if I have questions?  When are the best times to check my blood glucose? Where to find more information:  American Diabetes Association: diabetes.org  Academy of Nutrition and Dietetics: www.eatright.[employer] of Diabetes and Digestive and Kidney Diseases (NIH): [employer] Summary  A healthy meal plan will help you control your blood glucose and maintain a healthy lifestyle.  Working with a diet and nutrition specialist (dietitian) can help you make a meal plan that is best for you.  Keep in mind that carbohydrates (carbs) and alcohol have immediate effects on your blood glucose levels. It is important to count carbs and to use alcohol carefully. This information is not intended to replace advice given to you by your health care provider. Make sure you discuss any questions you have with your health care provider. Document Revised: 09/16/2017 Document Reviewed: 11/08/2016 Elsevier Patient Education  2020 [employer].

## 2020-03-11 NOTE — Progress Notes (Signed)
Subjective:    Patient ID: Alexandra [name], female    DOB: 1966/05/24, 54 y.o.   MRN: [id number]  HPI  Pt is a 54 yo female with HTN, T2DM, HLD and vitamin D deficiency who presents to the clinic for 3 month follow up.   Patient has not been seen in the clinic in over 3 years.  She has not been on any medication.  She states it was some type of insurance issue.  She knows she is a diabetic and she wants to get back on her medication.  She admits she does not feel well.  She has frequent urination.  She feels tired and worn down a lot.  She is not keeping a diabetic diet.  She is not checking her sugars.  She denies any hypoglycemic event.  She denies any chest pain, palpitations, headache, shortness of breath, wheezing, dizziness or vision changes.  .. Active Ambulatory Problems    Diagnosis Date Noted  . Diabetes type 2, uncontrolled ([place]) 11/05/2015  . Hyperlipidemia LDL goal <70 11/05/2015  . Vitamin D deficiency 11/05/2015  . Essential hypertension, benign 12/02/2015  . Obesity 12/02/2015   Resolved Ambulatory Problems    Diagnosis Date Noted  . Elevated liver enzymes 11/05/2015   Past Medical History:  Diagnosis Date  . Allergy   . Diabetes mellitus without complication ([place])   . Hyperlipidemia   . Hypertension      Review of Systems See HPI.     Objective:   Physical Exam Vitals reviewed.  Constitutional:      Appearance: Normal appearance. She is obese.  HENT:     Head: Normocephalic.  Neck:     Vascular: No carotid bruit.  Cardiovascular:     Rate and Rhythm: Normal rate and regular rhythm.     Pulses: Normal pulses.  Pulmonary:     Effort: Pulmonary effort is normal.     Breath sounds: Normal breath sounds.  Musculoskeletal:     Right lower leg: No edema.     Left lower leg: No edema.  Neurological:     General: No focal deficit present.     Mental Status: She is alert and oriented to person, place, and time.  Psychiatric:        Mood and Affect: Mood  normal.           Assessment & Plan:  Marland KitchenMarland KitchenVelvilee was seen today for diabetes.  Diagnoses and all orders for this visit:  Uncontrolled type 2 diabetes mellitus with hyperglycemia ([place]) -     POCT glycosylated hemoglobin (Hb [name]) -     Dapagliflozin-metFORMIN HCl ER (XIGDUO XR) 07-999 MG TB24; Take 1 tablet by mouth daily. -     Semaglutide,0.25 or 0.5MG /DOS, (OZEMPIC, 0.25 OR 0.5 MG/DOSE,) 2 MG/1.5ML SOPN; Inject 0.5 mg into the skin once a week. -     COMPLETE METABOLIC PANEL WITH GFR -     insulin glargine, 2 Unit Dial, (TOUJEO MAX SOLOSTAR) 300 UNIT/ML Solostar Pen; Inject 10 Units into the skin at bedtime.  Essential hypertension, benign -     lisinopril (ZESTRIL) 10 MG tablet; Take 1 tablet (10 mg total) by mouth daily.  Encounter for screening mammogram for malignant neoplasm of breast -     MM 3D SCREEN BREAST BILATERAL  No energy -     CBC -     VITAMIN D 25 Hydroxy (Vit-D Deficiency, Fractures) -     COMPLETE METABOLIC PANEL WITH GFR -  Lipid Panel w/reflex Direct LDL -     B12 and Folate Panel  Screening for lipid disorders -     Lipid Panel w/reflex Direct LDL  Abnormal RBC indices -     Reticulocytes   .Marland Kitchen Results for orders placed or performed in visit on 03/11/20  CBC  Result Value Ref Range   WBC 5.9 3.8 - 10.8 Thousand/uL   RBC 6.46 (H) 3.80 - 5.10 Million/uL   Hemoglobin 15.0 11.7 - 15.5 g/dL   HCT 48.5 (H) 35.0 - 45.0 %   MCV 75.1 (L) 80.0 - 100.0 fL   MCH 23.2 (L) 27.0 - 33.0 pg   MCHC 30.9 (L) 32.0 - 36.0 g/dL   RDW 15.1 (H) 11.0 - 15.0 %   Platelets 273 140 - 400 Thousand/uL   MPV 12.0 7.5 - 12.5 fL  VITAMIN D 25 Hydroxy (Vit-D Deficiency, Fractures)  Result Value Ref Range   Vit D, 25-Hydroxy 9 (L) 30 - 100 ng/mL  COMPLETE METABOLIC PANEL WITH GFR  Result Value Ref Range   Glucose, Bld 359 (H) 65 - 99 mg/dL   BUN 14 7 - 25 mg/dL   Creat 0.89 0.50 - 1.05 mg/dL   GFR, Est Non African American 73 > OR = 60 mL/min/1.36m2   GFR, Est  African American 85 > OR = 60 mL/min/1.39m2   BUN/Creatinine Ratio NOT APPLICABLE 6 - 22 (calc)   Sodium 137 135 - 146 mmol/L   Potassium 4.8 3.5 - 5.3 mmol/L   Chloride 97 (L) 98 - 110 mmol/L   CO2 26 20 - 32 mmol/L   Calcium 10.0 8.6 - 10.4 mg/dL   Total Protein 8.0 6.1 - 8.1 g/dL   Albumin 4.5 3.6 - 5.1 g/dL   Globulin 3.5 1.9 - 3.7 g/dL (calc)   AG Ratio 1.3 1.0 - 2.5 (calc)   Total Bilirubin 0.4 0.2 - 1.2 mg/dL   Alkaline phosphatase (APISO) 125 37 - 153 U/L   AST 19 10 - 35 U/L   ALT 23 6 - 29 U/L  Lipid Panel w/reflex Direct LDL  Result Value Ref Range   Cholesterol 202 (H) <200 mg/dL   HDL 67 > OR = 50 mg/dL   Triglycerides 88 <150 mg/dL   LDL Cholesterol (Calc) 116 (H) mg/dL (calc)   Total CHOL/HDL Ratio 3.0 <5.0 (calc)   Non-HDL Cholesterol (Calc) 135 (H) <130 mg/dL (calc)  B12 and Folate Panel  Result Value Ref Range   Vitamin B-12 683 200 - 1,100 pg/mL   Folate 16.7 ng/mL  Reticulocytes  Result Value Ref Range   Retic Ct Pct CANCELED   POCT glycosylated hemoglobin (Hb [name])  Result Value Ref Range   Hemoglobin [name] 14.0 (A) 4.0 - 5.6 %   HbA1c POC (<> result, manual entry)     HbA1c, POC (prediabetic range)     HbA1c, POC (controlled diabetic range)     [name] terrible.  Added back medications that she has been out of for years.  Discussed risk of not controlling your [name].  Discussed diabetic diet.  Added ACE. BP not to goal.  Added statin. LDL of under 70.  .. Diabetic Foot Exam - Simple   Simple Foot Form Diabetic Foot exam was performed with the following findings: Yes 03/11/2020  1:32 PM  Visual Inspection No deformities, no ulcerations, no other skin breakdown bilaterally: Yes Sensation Testing Intact to touch and monofilament testing bilaterally: Yes Pulse Check Posterior Tibialis and Dorsalis pulse intact bilaterally: Yes  Comments    Needs eye exam.  Pneumonia and flu shot UTD.   Fasting labs ordered.   Follow up in 3 months.

## 2020-03-12 ENCOUNTER — Telehealth: Payer: Self-pay | Admitting: Physician Assistant

## 2020-03-12 NOTE — Telephone Encounter (Signed)
Received fax for PA on Xigduo sient through cover my meds waiting on determination. - CF

## 2020-03-14 ENCOUNTER — Other Ambulatory Visit: Payer: Self-pay | Admitting: Physician Assistant

## 2020-03-14 ENCOUNTER — Encounter: Payer: Self-pay | Admitting: Physician Assistant

## 2020-03-14 DIAGNOSIS — E559 Vitamin D deficiency, unspecified: Secondary | ICD-10-CM

## 2020-03-14 MED ORDER — VITAMIN D (ERGOCALCIFEROL) 1.25 MG (50000 UNIT) PO CAPS
50000.0000 [IU] | ORAL_CAPSULE | ORAL | 0 refills | Status: DC
Start: 2020-03-14 — End: 2020-04-18

## 2020-03-14 NOTE — Progress Notes (Signed)
Alexandra [name],   RBC is a little high. Lets add ferritin, reticulocytes.   Vitamin D VERY low. Need to start high dose for next 3 months then we can recheck. I will send 50,000 units weekly over.  B12 great.   Kidney and liver look good.   Your LDL needs to be under 70 with DM. We need to start lipitor. Ok to send to pharmacy for cholesterol.

## 2020-03-15 LAB — CBC
HCT: 48.5 % — ABNORMAL HIGH (ref 35.0–45.0)
Hemoglobin: 15 g/dL (ref 11.7–15.5)
MCH: 23.2 pg — ABNORMAL LOW (ref 27.0–33.0)
MCHC: 30.9 g/dL — ABNORMAL LOW (ref 32.0–36.0)
MCV: 75.1 fL — ABNORMAL LOW (ref 80.0–100.0)
MPV: 12 fL (ref 7.5–12.5)
Platelets: 273 10*3/uL (ref 140–400)
RBC: 6.46 10*6/uL — ABNORMAL HIGH (ref 3.80–5.10)
RDW: 15.1 % — ABNORMAL HIGH (ref 11.0–15.0)
WBC: 5.9 10*3/uL (ref 3.8–10.8)

## 2020-03-15 LAB — COMPLETE METABOLIC PANEL WITH GFR
AG Ratio: 1.3 (calc) (ref 1.0–2.5)
ALT: 23 U/L (ref 6–29)
AST: 19 U/L (ref 10–35)
Albumin: 4.5 g/dL (ref 3.6–5.1)
Alkaline phosphatase (APISO): 125 U/L (ref 37–153)
BUN: 14 mg/dL (ref 7–25)
CO2: 26 mmol/L (ref 20–32)
Calcium: 10 mg/dL (ref 8.6–10.4)
Chloride: 97 mmol/L — ABNORMAL LOW (ref 98–110)
Creat: 0.89 mg/dL (ref 0.50–1.05)
GFR, Est African American: 85 mL/min/{1.73_m2} (ref >=60)
GFR, Est Non African American: 73 mL/min/{1.73_m2} (ref >=60)
Globulin: 3.5 g/dL (calc) (ref 1.9–3.7)
Glucose, Bld: 359 mg/dL — ABNORMAL HIGH (ref 65–99)
Potassium: 4.8 mmol/L (ref 3.5–5.3)
Sodium: 137 mmol/L (ref 135–146)
Total Bilirubin: 0.4 mg/dL (ref 0.2–1.2)
Total Protein: 8 g/dL (ref 6.1–8.1)

## 2020-03-15 LAB — RETICULOCYTES

## 2020-03-15 LAB — LIPID PANEL W/REFLEX DIRECT LDL
Cholesterol: 202 mg/dL — ABNORMAL HIGH (ref <200)
HDL: 67 mg/dL (ref >=50)
LDL Cholesterol (Calc): 116 mg/dL (calc) — ABNORMAL HIGH
Non-HDL Cholesterol (Calc): 135 mg/dL (calc) — ABNORMAL HIGH (ref <130)
Total CHOL/HDL Ratio: 3 (calc) (ref <5.0)
Triglycerides: 88 mg/dL (ref <150)

## 2020-03-15 LAB — FERRITIN: Ferritin: 187 ng/mL (ref 16–232)

## 2020-03-15 LAB — B12 AND FOLATE PANEL
Folate: 16.7 ng/mL
Vitamin B-12: 683 pg/mL (ref 200–1100)

## 2020-03-15 LAB — VITAMIN D 25 HYDROXY (VIT D DEFICIENCY, FRACTURES): Vit D, 25-Hydroxy: 9 ng/mL — ABNORMAL LOW (ref 30–100)

## 2020-03-18 ENCOUNTER — Encounter: Payer: Self-pay | Admitting: Physician Assistant

## 2020-03-18 NOTE — Telephone Encounter (Signed)
Received fax for clinical documentation sent requested information waiting on insurance determination. - CF

## 2020-03-18 NOTE — Progress Notes (Signed)
Genie,   Iron stores are great! I do want to monitor elevated red blood cells and hematocrit. Recheck in 1 month. If still out of range I would like to send to hematology to evaluate for polycythemia vera where your body makes too many.

## 2020-03-19 ENCOUNTER — Other Ambulatory Visit: Payer: Self-pay

## 2020-03-19 ENCOUNTER — Telehealth: Payer: Self-pay

## 2020-03-19 ENCOUNTER — Ambulatory Visit (INDEPENDENT_AMBULATORY_CARE_PROVIDER_SITE_OTHER): Payer: 59

## 2020-03-19 DIAGNOSIS — Z1231 Encounter for screening mammogram for malignant neoplasm of breast: Secondary | ICD-10-CM | POA: Diagnosis not present

## 2020-03-19 DIAGNOSIS — R718 Other abnormality of red blood cells: Secondary | ICD-10-CM

## 2020-03-19 NOTE — Telephone Encounter (Signed)
Patient needs re-draw for reticulocytes due to unable to have add on done.  Patient is coming to have mammogram done today, called and asked her to stop by lab to have this done after mammogram, patient agreeable.   Re-ordered lab and sent to [place] to PCP

## 2020-03-19 NOTE — Telephone Encounter (Signed)
thanks

## 2020-03-20 ENCOUNTER — Encounter: Payer: Self-pay | Admitting: Physician Assistant

## 2020-03-20 LAB — RETICULOCYTES
ABS Retic: 58590 cells/uL (ref 20000–8000)
Retic Ct Pct: 0.9 %

## 2020-03-21 ENCOUNTER — Other Ambulatory Visit: Payer: Self-pay

## 2020-03-21 DIAGNOSIS — R718 Other abnormality of red blood cells: Secondary | ICD-10-CM

## 2020-03-21 MED ORDER — FREESTYLE LIBRE 14 DAY SENSOR MISC
1.0000 | 11 refills | Status: DC
Start: 2020-03-21 — End: 2020-04-17

## 2020-03-21 MED ORDER — FREESTYLE LIBRE 14 DAY READER DEVI
1.0000 | 11 refills | Status: DC
Start: 2020-03-21 — End: 2020-04-17

## 2020-03-21 NOTE — Telephone Encounter (Signed)
Alexandra [name],   You do not smoke and have never smoked right?    you are making plenty of red blood cells. This clinic picture looks a lot like body is just making more RBC than need. Lets confirm in 2 weeks not one month if picture looks like this will send to hematology to work up for polycythemia vera a condition of making too many RBC.

## 2020-03-21 NOTE — Progress Notes (Signed)
Alexandra [name],   Normal mammogram. Follow up in one year.

## 2020-03-24 ENCOUNTER — Encounter: Payer: Self-pay | Admitting: Physician Assistant

## 2020-03-24 MED ORDER — SYNJARDY 12.5-1000 MG PO TABS
1.0000 | ORAL_TABLET | Freq: Two times a day (BID) | ORAL | 2 refills | Status: DC
Start: 2020-03-24 — End: 2020-06-17

## 2020-03-24 NOTE — Addendum Note (Signed)
Addended by: Donella Stade on: 03/24/2020 07:01 AM   Modules accepted: Orders

## 2020-03-25 NOTE — Telephone Encounter (Signed)
I sent reader and sensor? Can we call pharmacy to see what else they need.

## 2020-03-26 ENCOUNTER — Encounter: Payer: Self-pay | Admitting: Physician Assistant

## 2020-03-27 ENCOUNTER — Telehealth: Payer: Self-pay | Admitting: Neurology

## 2020-03-27 NOTE — Telephone Encounter (Signed)
Patient called in stating she has not felt very well over the last couple days. Her blood sugars have been low, but she hasn't felt like eating either. She states she just feels weak.  She has taken her diabetes meds as follows:  Ozempic - last Thursday  All meds taken as directed Tuesday  Wednesday: She only took [place] - 1 tablet around noon She skipped her 10 units of Toujeo  She checked her sugars yesterday evening and they were 84  Today: has not taken any DM meds Blood surgar was 74 around noon She has only even some ham and had a sugar free lemonade  I made patient aware that she should  be eating more and this could definitely be contributing to how she feels. I let her know that I would send a message to see if she should do anything different with her medications.   (Copied to Clarise Cruz, since Gore is not here today)

## 2020-03-28 NOTE — Telephone Encounter (Signed)
Patient made aware of recommendations. She will send a message on Monday with blood sugars.

## 2020-03-28 NOTE — Telephone Encounter (Signed)
Stop toujeo and do not take it any more. Usuallly synjardy and ozempic do not cause lower than normal blood sugars. Make sure eating frequent meals and snacks for now but try to keep the low carb and healthy. Stop the synjardy for now. Only do ozempic once a week. Report sugar readings on Monday for the next few days.

## 2020-03-31 NOTE — Telephone Encounter (Signed)
Patient left a vm at 3 pm stating she had a horrible weekend. She feels better the further she gets from Thursday (Ozempic injection day) but has had vomiting, reflux, and hasn't been able to eat much since that day. She feels weak. She is feeling better, but not sure if she should do anything different.

## 2020-04-01 NOTE — Telephone Encounter (Signed)
Spoke with patient - she states still having some stomach pains but controlled with aleve. No more vomiting/reflux/weakness.  She is feeling much better now. She has not been checking blood sugars.   I instructed patient to check blood sugars over the next few days so we can come up with a plan for medications. She will let us know via mychart what these numbers are running.

## 2020-04-01 NOTE — Telephone Encounter (Signed)
Next ozempic dose I would do the .25mg  that is the lowest dose. How much did she take? Keep checking at least morning sugars to let us know where sugars are averaging.

## 2020-04-01 NOTE — Telephone Encounter (Signed)
LMOM for patient to call back and let me know which dose of Ozempic she is taking.

## 2020-04-08 ENCOUNTER — Encounter: Payer: Self-pay | Admitting: Physician Assistant

## 2020-04-09 NOTE — Telephone Encounter (Signed)
Ok to send sensor she needs?

## 2020-04-11 NOTE — Telephone Encounter (Signed)
Patient left vm stating doing better on Ozempic 0.25 mg and Synjardy, no more stomach issues.   Asking again about Freestyle Libre 14 day monitor. This was already sent to pharmacy. Mychart message sent to patient.

## 2020-04-17 ENCOUNTER — Other Ambulatory Visit: Payer: Self-pay | Admitting: Neurology

## 2020-04-17 MED ORDER — FREESTYLE LIBRE 14 DAY READER DEVI: 1.0000 | 0 refills | Status: DC

## 2020-04-17 MED ORDER — FREESTYLE LIBRE 14 DAY SENSOR MISC: 1.0000 | 11 refills | Status: DC

## 2020-04-17 NOTE — Telephone Encounter (Signed)
Patient called again for RX for Freestyle Libre 14 day sensor/reader. This has been sent, but sent another RX for both to patient's pharmacy per her request.

## 2020-04-18 MED ORDER — VITAMIN D (ERGOCALCIFEROL) 1.25 MG (50000 UNIT) PO CAPS: 50000.0000 [IU] | ORAL_CAPSULE | ORAL | 0 refills | Status: DC

## 2020-04-18 NOTE — Telephone Encounter (Signed)
Vitamin D sent. Freestyle RX sent again yesterday, documented in another encounter. Patient left VM yesterday.

## 2020-04-18 NOTE — Telephone Encounter (Signed)
Please send the freestyle rx again to the pharmacy for patient. She is saying it is now there.  Please send vitamin D in for weekly for 3 months.

## 2020-04-18 NOTE — Addendum Note (Signed)
Addended by: Annamaria Helling on: 04/18/2020 08:49 AM   Modules accepted: Orders

## 2020-05-16 ENCOUNTER — Encounter: Payer: Self-pay | Admitting: Physician Assistant

## 2020-06-03 ENCOUNTER — Telehealth: Payer: Self-pay | Admitting: Neurology

## 2020-06-03 MED ORDER — FLUCONAZOLE 150 MG PO TABS: 150.0000 mg | ORAL_TABLET | Freq: Once | ORAL | 0 refills | Status: AC

## 2020-06-03 NOTE — Telephone Encounter (Signed)
Patient left vm stating she was told when she started Synjardy to call if she develops any vaginal irritation. She has just started having some itching over the last couple days. Wants to know if there is something to treat or if she would have to switch medications.   Should patient be seen to rule out anything different causing this?

## 2020-06-03 NOTE — Telephone Encounter (Signed)
Patient made aware of recommendations.   

## 2020-06-03 NOTE — Telephone Encounter (Signed)
I will send diflucan to take now and then in 48 hours. If continues we can talk about other options.

## 2020-06-17 ENCOUNTER — Other Ambulatory Visit: Payer: Self-pay | Admitting: Physician Assistant

## 2020-06-18 ENCOUNTER — Other Ambulatory Visit: Payer: Self-pay | Admitting: Neurology

## 2020-06-18 LAB — HM DIABETES EYE EXAM

## 2020-06-18 MED ORDER — BLOOD GLUCOSE METER KIT: PACK | 0 refills | Status: DC

## 2020-06-20 ENCOUNTER — Ambulatory Visit: Payer: 59 | Admitting: Physician Assistant

## 2020-06-24 ENCOUNTER — Ambulatory Visit (INDEPENDENT_AMBULATORY_CARE_PROVIDER_SITE_OTHER): Payer: 59 | Admitting: Physician Assistant

## 2020-06-24 ENCOUNTER — Other Ambulatory Visit: Payer: Self-pay

## 2020-06-24 ENCOUNTER — Encounter: Payer: Self-pay | Admitting: Physician Assistant

## 2020-06-24 VITALS — BP 124/74 | HR 102 | Ht 64.0 in | Wt 180.0 lb

## 2020-06-24 DIAGNOSIS — L304 Erythema intertrigo: Secondary | ICD-10-CM

## 2020-06-24 DIAGNOSIS — E1165 Type 2 diabetes mellitus with hyperglycemia: Secondary | ICD-10-CM

## 2020-06-24 DIAGNOSIS — R718 Other abnormality of red blood cells: Secondary | ICD-10-CM

## 2020-06-24 DIAGNOSIS — E559 Vitamin D deficiency, unspecified: Secondary | ICD-10-CM | POA: Diagnosis not present

## 2020-06-24 LAB — POCT GLYCOSYLATED HEMOGLOBIN (HGB A1C): Hemoglobin A1C: 8.3 % — AB (ref 4.0–5.6)

## 2020-06-24 MED ORDER — OZEMPIC (1 MG/DOSE) 2 MG/1.5ML ~~LOC~~ SOPN: 1.0000 mg | PEN_INJECTOR | SUBCUTANEOUS | 2 refills | Status: DC

## 2020-06-24 MED ORDER — CLOTRIMAZOLE-BETAMETHASONE 1-0.05 % EX CREA: 1.0000 "application " | TOPICAL_CREAM | Freq: Two times a day (BID) | CUTANEOUS | 1 refills | Status: DC

## 2020-06-24 MED ORDER — SYNJARDY 12.5-1000 MG PO TABS: 1.0000 | ORAL_TABLET | Freq: Two times a day (BID) | ORAL | 2 refills | Status: DC

## 2020-06-24 NOTE — Patient Instructions (Signed)
Intertrigo Intertrigo is skin irritation (inflammation) that happens in warm, moist areas of the body. The irritation can cause a rash and make skin raw and itchy. The rash is usually pink or red. It happens mostly between folds of skin or where skin rubs together, such as:  Between the toes.  In the armpits.  In the groin area.  Under the belly.  Under the breasts.  Around the butt area. This condition is not passed from person to person (is not contagious). What are the causes?  Heat, moisture, rubbing, and not enough air movement.  The condition can be made worse by: ? Sweat. ? Bacteria. ? A fungus, such as yeast. What increases the risk?  Moisture in your skin folds.  You are more likely to develop this condition if you: ? Have diabetes. ? Are overweight. ? Are not able to move around. ? Live in a warm and moist climate. ? Wear splints, braces, or other medical devices. ? Are not able to control your pee (urine) or poop (stool). What are the signs or symptoms?  A pink or red skin rash in the skin fold or near the skin fold.  Raw or scaly skin.  Itching.  A burning feeling.  Bleeding.  Leaking fluid.  A bad smell. How is this treated?  Cleaning and drying your skin.  Taking an antibiotic medicine or using an antibiotic skin cream for a bacterial infection.  Using an antifungal cream on your skin or taking pills for an infection that was caused by a fungus, such as yeast.  Using a steroid ointment to stop the itching and irritation.  Separating the skin fold with a clean cotton cloth to absorb moisture and allow air to flow into the area. Follow these instructions at home:  Keep the affected area clean and dry.  Do not scratch your skin.  Stay cool as much as you can. Use an air conditioner or a fan, if you have one.  Apply over-the-counter and prescription medicines only as told by your doctor.  If you were prescribed an antibiotic medicine,  use it as told by your doctor. Do not stop using the antibiotic even if your condition starts to get better.  Keep all follow-up visits as told by your doctor. This is important. How is this prevented?   Stay at a healthy weight.  Take care of your feet. This is very important if you have diabetes. You should: ? Wear shoes that fit well. ? Keep your feet dry. ? Wear clean cotton or wool socks.  Protect the skin in your groin and butt area as told by your doctor. To do this: ? Follow a regular cleaning routine. ? Use creams, powders, or ointments that protect your skin. ? Change protection pads often.  Do not wear tight clothes. Wear clothes that: ? Are loose. ? Take moisture away from your body. ? Are made of cotton.  Wear a bra that gives good support, if needed.  Shower and dry yourself well after being active. Use a hair dryer on a cool setting to dry between skin folds.  Keep your blood sugar under control if you have diabetes. Contact a doctor if:  Your symptoms do not get better with treatment.  Your symptoms get worse or they spread.  You notice more redness and warmth.  You have a fever. Summary  Intertrigo is skin irritation that occurs when folds of skin rub together.  This condition is caused by heat, moisture,   and rubbing.  This condition may be treated by cleaning and drying your skin and with medicines.  Apply over-the-counter and prescription medicines only as told by your doctor.  Keep all follow-up visits as told by your doctor. This is important. This information is not intended to replace advice given to you by your health care provider. Make sure you discuss any questions you have with your health care provider. Document Revised: 07/13/2018 Document Reviewed: 07/13/2018 Elsevier Patient Education  2020 Elsevier Inc.  

## 2020-06-24 NOTE — Progress Notes (Signed)
Subjective:    Patient ID: Alexandra [name], female    DOB: 07-Feb-1966, 54 y.o.   MRN: [id number]  HPI  Patient is a 54 year old obese female with hypertension, type 2 diabetes, hyperlipidemia, vitamin D deficiency who presents to the clinic for 89-month follow-up.  Overall patient is doing very well.  She is checking her sugars frequently with her libre monitor. In the morning ranging 110-140s.  She is able to noticed certain foods that really cause her blood glucose to spike.  Serial is one of them.  She has made quite a few diet changes.  She has lost 22 pounds.  She is taking Ozempic and Synjardy.  She denies any significant side effects.  She does have a little GI nausea with Ozempic.  She denies any hypoglycemic events.  She denies any open sores or wounds.  She denies any chest pain, shortness of breath, palpitations, vision changes, headaches.  Patient does have an itchy rash under left breast.  She is put some hydrocortisone cream on this which has made it a little better.  Continues to itch.  She has had this before off and on.  .. Active Ambulatory Problems    Diagnosis Date Noted  . Diabetes type 2, uncontrolled ([place]) 11/05/2015  . Hyperlipidemia LDL goal <70 11/05/2015  . Vitamin D deficiency 11/05/2015  . Essential hypertension, benign 12/02/2015  . Obesity 12/02/2015  . Elevated hematocrit 06/24/2020  . Elevated red blood cell count 06/24/2020  . Intertrigo 06/24/2020   Resolved Ambulatory Problems    Diagnosis Date Noted  . Elevated liver enzymes 11/05/2015   Past Medical History:  Diagnosis Date  . Allergy   . Diabetes mellitus without complication ([place])   . Hyperlipidemia   . Hypertension      Review of Systems See HPI.     Objective:   Physical Exam Vitals reviewed.  Constitutional:      Appearance: Normal appearance. She is obese.  Cardiovascular:     Rate and Rhythm: Normal rate and regular rhythm.     Pulses: Normal pulses.     Heart sounds: Normal  heart sounds.  Pulmonary:     Effort: Pulmonary effort is normal.  Skin:    Comments: Left breast in folds macular hyperpigmentation with definite borders.  Neurological:     General: No focal deficit present.     Mental Status: She is alert and oriented to person, place, and time.  Psychiatric:        Mood and Affect: Mood normal.           Assessment & Plan:  Marland KitchenMarland KitchenVelvilee was seen today for diabetes.  Diagnoses and all orders for this visit:  Uncontrolled type 2 diabetes mellitus with hyperglycemia (HCC) -     POCT glycosylated hemoglobin (Hb [name]) -     Semaglutide, 1 MG/DOSE, (OZEMPIC, 1 MG/DOSE,) 2 MG/1.5ML SOPN; Inject 0.75 mLs (1 mg total) into the skin once a week. -     Empagliflozin-metFORMIN HCl (SYNJARDY) 12.02-999 MG TABS; Take 1 tablet by mouth 2 (two) times daily.  Elevated hematocrit -     CBC w/Diff/Platelet -     Erythropoietin  Elevated red blood cell count -     CBC w/Diff/Platelet -     Erythropoietin  Vitamin D deficiency -     VITAMIN D 25 Hydroxy (Vit-D Deficiency, Fractures)  Intertrigo -     clotrimazole-betamethasone (LOTRISONE) cream; Apply 1 application topically 2 (two) times daily.   .. Results for  orders placed or performed in visit on 06/24/20  POCT glycosylated hemoglobin (Hb [name])  Result Value Ref Range   Hemoglobin [name] 8.3 (A) 4.0 - 5.6 %   HbA1c POC (<> result, manual entry)     HbA1c, POC (prediabetic range)     HbA1c, POC (controlled diabetic range)     [name] has improved significantly down to 8.3.  She is also lost 22 pounds. We will increase Ozempic to 1 mg weekly. Continue on Synjardy. Blood pressure to goal.  Continue on ACE.   On statin. Foot exam UTD.  Follow up in 3 months.   Labs ordered to recheck RBC/hematocrit due to elevation. Pt does not drink alcohol or smoke.   Recheck Vitamin D after start of supplementation.   Discussed rash appears more yeast like intertrigo. HO given lotrisone to start. Encouraged to  keep breast area dry with gold bond.   Patient declines immunizations. Needs Pap.

## 2020-06-25 ENCOUNTER — Other Ambulatory Visit: Payer: Self-pay | Admitting: Physician Assistant

## 2020-06-25 ENCOUNTER — Encounter: Payer: Self-pay | Admitting: Physician Assistant

## 2020-06-25 DIAGNOSIS — R718 Other abnormality of red blood cells: Secondary | ICD-10-CM

## 2020-06-25 LAB — CBC WITH DIFFERENTIAL/PLATELET
Absolute Monocytes: 518 cells/uL (ref 200–950)
Basophils Absolute: 43 cells/uL (ref 0–200)
Basophils Relative: 0.6 %
Eosinophils Absolute: 227 cells/uL (ref 15–500)
Eosinophils Relative: 3.2 %
HCT: 45.4 % — ABNORMAL HIGH (ref 35.0–45.0)
Hemoglobin: 14.1 g/dL (ref 11.7–15.5)
Lymphs Abs: 1903 cells/uL (ref 850–3900)
MCH: 23.1 pg — ABNORMAL LOW (ref 27.0–33.0)
MCHC: 31.1 g/dL — ABNORMAL LOW (ref 32.0–36.0)
MCV: 74.3 fL — ABNORMAL LOW (ref 80.0–100.0)
MPV: 10.8 fL (ref 7.5–12.5)
Monocytes Relative: 7.3 %
Neutro Abs: 4409 cells/uL (ref 1500–7800)
Neutrophils Relative %: 62.1 %
Platelets: 351 10*3/uL (ref 140–400)
RBC: 6.11 10*6/uL — ABNORMAL HIGH (ref 3.80–5.10)
RDW: 14.7 % (ref 11.0–15.0)
Total Lymphocyte: 26.8 %
WBC: 7.1 10*3/uL (ref 3.8–10.8)

## 2020-06-25 LAB — VITAMIN D 25 HYDROXY (VIT D DEFICIENCY, FRACTURES): Vit D, 25-Hydroxy: 38 ng/mL (ref 30–100)

## 2020-06-25 LAB — ERYTHROPOIETIN: Erythropoietin: 13.7 m[IU]/mL (ref 2.6–18.5)

## 2020-06-25 NOTE — Progress Notes (Signed)
Ngina,   Vitamin D much better at 38. I still want you to take D3 OTC 2000units a day once you finish your weekly vitamin D.  RBC and hematocrit still elevated but better than 3 months ago. Still waiting on erythropoietin lab. Are you staying pretty well hydrated?  I would start a daily ASA 81mg .   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

## 2020-06-26 ENCOUNTER — Telehealth: Payer: Self-pay | Admitting: Family

## 2020-06-26 NOTE — Telephone Encounter (Signed)
I called and spoke with patient regarding New patient appointment date/time.  He was in agreement with both. New patient packet was mailed as well

## 2020-07-11 ENCOUNTER — Other Ambulatory Visit: Payer: Self-pay | Admitting: Physician Assistant

## 2020-07-14 NOTE — Telephone Encounter (Signed)
Medication denied, - CF

## 2020-07-18 ENCOUNTER — Other Ambulatory Visit: Payer: Self-pay | Admitting: Family

## 2020-07-18 DIAGNOSIS — R718 Other abnormality of red blood cells: Secondary | ICD-10-CM

## 2020-07-18 DIAGNOSIS — D751 Secondary polycythemia: Secondary | ICD-10-CM

## 2020-07-21 ENCOUNTER — Inpatient Hospital Stay (HOSPITAL_BASED_OUTPATIENT_CLINIC_OR_DEPARTMENT_OTHER): Payer: 59 | Admitting: Family

## 2020-07-21 ENCOUNTER — Encounter: Payer: Self-pay | Admitting: Family

## 2020-07-21 ENCOUNTER — Other Ambulatory Visit: Payer: Self-pay

## 2020-07-21 ENCOUNTER — Inpatient Hospital Stay: Payer: 59 | Attending: Hematology & Oncology

## 2020-07-21 VITALS — BP 112/84 | HR 95 | Temp 98.8°F | Resp 18 | Ht 64.0 in | Wt 178.8 lb

## 2020-07-21 DIAGNOSIS — R718 Other abnormality of red blood cells: Secondary | ICD-10-CM

## 2020-07-21 DIAGNOSIS — R5383 Other fatigue: Secondary | ICD-10-CM | POA: Insufficient documentation

## 2020-07-21 DIAGNOSIS — D573 Sickle-cell trait: Secondary | ICD-10-CM | POA: Insufficient documentation

## 2020-07-21 DIAGNOSIS — Z801 Family history of malignant neoplasm of trachea, bronchus and lung: Secondary | ICD-10-CM | POA: Diagnosis not present

## 2020-07-21 DIAGNOSIS — Z807 Family history of other malignant neoplasms of lymphoid, hematopoietic and related tissues: Secondary | ICD-10-CM | POA: Insufficient documentation

## 2020-07-21 DIAGNOSIS — R232 Flushing: Secondary | ICD-10-CM | POA: Insufficient documentation

## 2020-07-21 DIAGNOSIS — E119 Type 2 diabetes mellitus without complications: Secondary | ICD-10-CM | POA: Insufficient documentation

## 2020-07-21 DIAGNOSIS — D751 Secondary polycythemia: Secondary | ICD-10-CM

## 2020-07-21 LAB — CBC WITH DIFFERENTIAL (CANCER CENTER ONLY)
Abs Immature Granulocytes: 0.06 10*3/uL (ref 0.00–0.07)
Basophils Absolute: 0 10*3/uL (ref 0.0–0.1)
Basophils Relative: 1 %
Eosinophils Absolute: 0.2 10*3/uL (ref 0.0–0.5)
Eosinophils Relative: 3 %
HCT: 43.1 % (ref 36.0–46.0)
Hemoglobin: 13.5 g/dL (ref 12.0–15.0)
Immature Granulocytes: 1 %
Lymphocytes Relative: 25 %
Lymphs Abs: 1.6 10*3/uL (ref 0.7–4.0)
MCH: 23.4 pg — ABNORMAL LOW (ref 26.0–34.0)
MCHC: 31.3 g/dL (ref 30.0–36.0)
MCV: 74.7 fL — ABNORMAL LOW (ref 80.0–100.0)
Monocytes Absolute: 0.4 10*3/uL (ref 0.1–1.0)
Monocytes Relative: 6 %
Neutro Abs: 4.1 10*3/uL (ref 1.7–7.7)
Neutrophils Relative %: 64 %
Platelet Count: 348 10*3/uL (ref 150–400)
RBC: 5.77 MIL/uL — ABNORMAL HIGH (ref 3.87–5.11)
RDW: 14.4 % (ref 11.5–15.5)
WBC Count: 6.3 10*3/uL (ref 4.0–10.5)
nRBC: 0 % (ref 0.0–0.2)

## 2020-07-21 LAB — CMP (CANCER CENTER ONLY)
ALT: 22 U/L (ref 0–44)
AST: 19 U/L (ref 15–41)
Albumin: 4.6 g/dL (ref 3.5–5.0)
Alkaline Phosphatase: 96 U/L (ref 38–126)
Anion gap: 8 (ref 5–15)
BUN: 10 mg/dL (ref 6–20)
CO2: 28 mmol/L (ref 22–32)
Calcium: 10.3 mg/dL (ref 8.9–10.3)
Chloride: 100 mmol/L (ref 98–111)
Creatinine: 0.96 mg/dL (ref 0.44–1.00)
GFR, Est AFR Am: >60 mL/min (ref >60)
GFR, Estimated: >60 mL/min (ref >60)
Glucose, Bld: 189 mg/dL — ABNORMAL HIGH (ref 70–99)
Potassium: 4.1 mmol/L (ref 3.5–5.1)
Sodium: 136 mmol/L (ref 135–145)
Total Bilirubin: 0.4 mg/dL (ref 0.3–1.2)
Total Protein: 8.2 g/dL — ABNORMAL HIGH (ref 6.5–8.1)

## 2020-07-21 LAB — RETICULOCYTES
Immature Retic Fract: 5.8 % (ref 2.3–15.9)
RBC.: 5.8 MIL/uL — ABNORMAL HIGH (ref 3.87–5.11)
Retic Count, Absolute: 64.4 10*3/uL (ref 19.0–186.0)
Retic Ct Pct: 1.1 % (ref 0.4–3.1)

## 2020-07-21 LAB — SAVE SMEAR(SSMR), FOR PROVIDER SLIDE REVIEW

## 2020-07-21 NOTE — Progress Notes (Signed)
Hematology/Oncology Consultation   Name: EKNOOR NOVACK      MRN: [id number]    Location: Room/bed info not found  Date: 07/21/2020 Time:2:41 PM   REFERRING PHYSICIAN: Tandy Gaw, PA-C  REASON FOR CONSULT: Elevated Hematocrit    DIAGNOSIS: Elevated Hematocrit   HISTORY OF PRESENT ILLNESS: Ms. Swetz is a very pleasant 54 yo African American female with recent history of elevated hematocrit. 4 months ago her Hct was 48%, 3 weeks ago 45% and today she is stable at 43%.  Erythropoietin in September was 13.7.  No family history of elevated Hct.  She does not take any hormone replacement or workout supplements.  No ETOH, smoking or recreational drug use.  No personal history of cancer. Mother had T-cell lymphoma st 31 yo and her niece also had lymphoma in her 36's. 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  No history of miscarriage.  She has hot flashes at times and states that she is going through the change of life.  She is up to date on her mammogram. Last done in June, was negative.  She had her first colonoscopy in April 2017 with Dr. Lavon Paganini and this was negative. She will be due again in 2027.   No episodes of bleeding. No bruising or petechiae.  She is diabetic and her Hgb [name] has improved tremendously on Ozempic. [name] in September was 8.3 (previously 14.0 in May).  She has some occasional fatigue.  No fever, chills, n/v, cough, rash, SOB, chest pain, palpitations, abdominal pain or changes in bowel or bladder habits.  She notes that if she bends forward in half she may get a little dizzy.  No swelling, tenderness, numbness or tingling in her extremities.  No falls or syncopal episodes to report.  She has maintained a good appetite and is staying well hydrated. Her weight is stable.  She uses her yoga ball, stationary trampoline or walks for exercise.   ROS: All other 10 point review of systems is negative.   PAST MEDICAL HISTORY:   Past Medical History:   Diagnosis Date  . Allergy   . Diabetes mellitus without complication (HCC)   . Hyperlipidemia    borderline- not on meds-diet controlled  . Hypertension     ALLERGIES: No Known Allergies    MEDICATIONS:  Current Outpatient Medications on File Prior to Visit  Medication Sig Dispense Refill  . AMBULATORY NON FORMULARY MEDICATION Glucometer, test strips, and lancets 100 each 0  . AMBULATORY NON FORMULARY MEDICATION Toujeo pen needles 200 each 3  . BAYER CONTOUR NEXT TEST test strip CHECK FASTING BLOOD SUGAR ONCE DAILY. DIAGNOSIS TYPE 2 DIABETES ICD 10 E11.65 100 each 3  . BAYER MICROLET LANCETS lancets CHECK FASTING BLOOD SUGAR ONCE DAILY 100 each 0  . blood glucose meter kit and supplies Contour Next per pt request/ Use up to four times daily as directed. (FOR ICD-10 E10.9, E11.9). 1 each 0  . Blood Glucose Monitoring Suppl (BAYER CONTOUR NEXT MONITOR) w/Device KIT Check fasting blood sugar once daily. Diagnosis Type 2 Diabetes ICD 10 E11.65 1 kit each  . Canagliflozin-Metformin HCl (INVOKAMET) [phone] MG TABS Take one tablet twice a day. 60 tablet 2  . clotrimazole-betamethasone (LOTRISONE) cream Apply 1 application topically 2 (two) times daily. 45 g 1  . Continuous Blood Gluc Receiver (FREESTYLE LIBRE 14 DAY READER) DEVI 1 applicator by Does not apply route every 14 (fourteen) days. 1 each 0  . Continuous Blood Gluc Sensor (FREESTYLE  LIBRE 14 DAY SENSOR) MISC 1 Device by Does not apply route every 14 (fourteen) days. 2 each 11  . Empagliflozin-metFORMIN HCl (SYNJARDY) 12.02-999 MG TABS Take 1 tablet by mouth 2 (two) times daily. 60 tablet 2  . lisinopril (ZESTRIL) 10 MG tablet Take 1 tablet (10 mg total) by mouth daily. 90 tablet 3  . Multiple Vitamin (MULTIVITAMIN) tablet Take 1 tablet by mouth daily.    Marland Kitchen NOVOFINE PLUS 32G X 4 MM MISC USE AS DIRECTED FOR TOUJEO 200 each 2  . Semaglutide, 1 MG/DOSE, (OZEMPIC, 1 MG/DOSE,) 2 MG/1.5ML SOPN Inject 0.75 mLs (1 mg total) into the skin once  a week. 3 mL 2  . Vitamin D, Ergocalciferol, (DRISDOL) 1.25 MG (50000 UNIT) CAPS capsule TAKE 1 CAPSULE (50,000 UNITS TOTAL) BY MOUTH EVERY 7 (SEVEN) DAYS. 12 capsule 0   No current facility-administered medications on file prior to visit.     PAST SURGICAL HISTORY Past Surgical History:  Procedure Laterality Date  . BREAST REDUCTION SURGERY  2005  . CESAREAN SECTION    . REDUCTION MAMMAPLASTY      FAMILY HISTORY: Family History  Problem Relation Age of Onset  . Cancer Mother        lymphoma  . Hypertension Mother   . Colon polyps Mother   . Hypertension Father   . Colon polyps Father   . Diabetes Sister   . Hypertension Sister   . Thyroid disease Sister   . Alzheimer's disease Maternal Aunt   . Alcohol abuse Maternal Uncle   . Alcohol abuse Maternal Grandfather   . Heart attack Paternal Grandfather   . Throat cancer Maternal Uncle        drinker per pt   . Colon cancer Neg Hx   . Rectal cancer Neg Hx   . Stomach cancer Neg Hx     SOCIAL HISTORY:  reports that she has never smoked. She has never used smokeless tobacco. She reports current alcohol use. She reports that she does not use drugs.  PERFORMANCE STATUS: The patient's performance status is 0 - Asymptomatic  PHYSICAL EXAM: Most Recent Vital Signs: Last menstrual period 09/19/2015. LMP 09/19/2015 Comment: none since dec 2016  General Appearance:    Alert, cooperative, no distress, appears stated age  Head:    Normocephalic, without obvious abnormality, atraumatic  Eyes:    PERRL, conjunctiva/corneas clear, EOM's intact, fundi    benign, both eyes        Throat:   Lips, mucosa, and tongue normal; teeth and gums normal  Neck:   Supple, symmetrical, trachea midline, no adenopathy;    thyroid:  no enlargement/tenderness/nodules; no carotid   bruit or JVD  Back:     Symmetric, no curvature, ROM normal, no CVA tenderness  Lungs:     Clear to auscultation bilaterally, respirations unlabored  Chest Wall:    No  tenderness or deformity   Heart:    Regular rate and rhythm, S1 and S2 normal, no murmur, rub   or gallop     Abdomen:     Soft, non-tender, bowel sounds active all four quadrants,    no masses, no organomegaly        Extremities:   Extremities normal, atraumatic, no cyanosis or edema  Pulses:   2+ and symmetric all extremities  Skin:   Skin color, texture, turgor normal, no rashes or lesions  Lymph nodes:   Cervical, supraclavicular, and axillary nodes normal  Neurologic:   CNII-XII intact, normal strength, sensation  and reflexes    throughout    LABORATORY DATA:  No results found for this or any previous visit (from the past 48 hour(s)).    RADIOGRAPHY: No results found.     PATHOLOGY: None  ASSESSMENT/PLAN: Ms. Blakesley is a very pleasant 54 yo [place] American female with recent history of elevated hematocrit.  Hct today is back within normal limits. Her improvement appears to correlate with the improvement in her Hgb [name].  CBC and diff are unremarkable at this time.  With her history of sickle cell trait I did recommend she take an OTC folic acid supplement daily.  We will plan to see her again in 6 months and if everything remains stable at that time we can let her go from our office.   All questions were answered and she is in agreement with the plan. She can contact our office with any questions or concerns. We can certainly see her sooner if needed.   The patient was discussed with Dr. Marin Olp and he is in agreement with the aforementioned.   Laverna Peace, NP

## 2020-07-22 ENCOUNTER — Telehealth: Payer: Self-pay | Admitting: Family

## 2020-07-22 LAB — IRON AND TIBC
Iron: 55 ug/dL (ref 41–142)
Saturation Ratios: 18 % — ABNORMAL LOW (ref 21–57)
TIBC: 304 ug/dL (ref 236–444)
UIBC: 249 ug/dL (ref 120–384)

## 2020-07-22 LAB — LACTATE DEHYDROGENASE: LDH: 122 U/L (ref 98–192)

## 2020-07-22 NOTE — Telephone Encounter (Signed)
Appointments scheduled calendar printed & mailed per 10/4 los

## 2020-07-24 ENCOUNTER — Other Ambulatory Visit: Payer: Self-pay | Admitting: Physician Assistant

## 2020-07-24 DIAGNOSIS — E1165 Type 2 diabetes mellitus with hyperglycemia: Secondary | ICD-10-CM

## 2020-07-25 ENCOUNTER — Encounter: Payer: Self-pay | Admitting: Physician Assistant

## 2020-07-25 ENCOUNTER — Other Ambulatory Visit: Payer: Self-pay | Admitting: Neurology

## 2020-07-25 DIAGNOSIS — E1165 Type 2 diabetes mellitus with hyperglycemia: Secondary | ICD-10-CM

## 2020-07-25 MED ORDER — OZEMPIC (1 MG/DOSE) 2 MG/1.5ML ~~LOC~~ SOPN: 1.0000 mg | PEN_INJECTOR | SUBCUTANEOUS | 0 refills | Status: DC

## 2020-07-28 MED ORDER — CONTOUR NEXT TEST VI STRP: ORAL_STRIP | 12 refills | Status: DC

## 2020-07-30 ENCOUNTER — Other Ambulatory Visit: Payer: Self-pay | Admitting: Family

## 2020-08-07 LAB — JAK 2 V617F (GENPATH)

## 2020-08-12 ENCOUNTER — Telehealth: Payer: Self-pay | Admitting: Family

## 2020-08-12 NOTE — Telephone Encounter (Signed)
I was able to go over Alexandra [name] lab work with her including JAK2 testing and iron studies. [name] 2 DNMT3Ap.Arg19Gln which is a Tier DZH:GDJMEQA of unknown significance. No ET, PV or MDS noted.  She will try starting gentle iron for IDA. We will see her in 6 months.  No other questions at this time. Patient appreciative of call.

## 2020-09-23 ENCOUNTER — Encounter: Payer: Self-pay | Admitting: Physician Assistant

## 2020-09-23 ENCOUNTER — Other Ambulatory Visit: Payer: Self-pay

## 2020-09-23 ENCOUNTER — Ambulatory Visit: Payer: 59 | Admitting: Physician Assistant

## 2020-09-23 VITALS — BP 122/76 | HR 108 | Ht 64.0 in | Wt 168.0 lb

## 2020-09-23 DIAGNOSIS — E559 Vitamin D deficiency, unspecified: Secondary | ICD-10-CM

## 2020-09-23 DIAGNOSIS — E663 Overweight: Secondary | ICD-10-CM

## 2020-09-23 DIAGNOSIS — E1165 Type 2 diabetes mellitus with hyperglycemia: Secondary | ICD-10-CM | POA: Diagnosis not present

## 2020-09-23 DIAGNOSIS — I10 Essential (primary) hypertension: Secondary | ICD-10-CM | POA: Diagnosis not present

## 2020-09-23 DIAGNOSIS — E785 Hyperlipidemia, unspecified: Secondary | ICD-10-CM | POA: Diagnosis not present

## 2020-09-23 LAB — POCT GLYCOSYLATED HEMOGLOBIN (HGB A1C): Hemoglobin A1C: 7.2 % — AB (ref 4.0–5.6)

## 2020-09-23 MED ORDER — SYNJARDY 12.5-1000 MG PO TABS: 1.0000 | ORAL_TABLET | Freq: Two times a day (BID) | ORAL | 0 refills | Status: DC

## 2020-09-23 MED ORDER — OZEMPIC (1 MG/DOSE) 2 MG/1.5ML ~~LOC~~ SOPN: 1.0000 mg | PEN_INJECTOR | SUBCUTANEOUS | 0 refills | Status: DC

## 2020-09-23 NOTE — Progress Notes (Signed)
Subjective:    Patient ID: Alexandra [name], female    DOB: [birth year], 54 y.o.   MRN: [id number]  HPI  Pt is a 54 yo overweight female with HTN, T2DM, HLD who presents to the clinic for 3 month follow up.   Her [name] have been improving. Last check was 8.3. her ozempic was increased at that time. She is tolerating great. She has lost 10lbs. She denies any side effects. she does not have any CP, palpitations, headaches. She continues to take synjardy as well. No hypoglycemic events.   .. Active Ambulatory Problems    Diagnosis Date Noted  . Diabetes type 2, uncontrolled ([place]) 11/05/2015  . Hyperlipidemia LDL goal <70 11/05/2015  . Vitamin D deficiency 11/05/2015  . Essential hypertension, benign 12/02/2015  . Elevated hematocrit 06/24/2020  . Elevated red blood cell count 06/24/2020  . Intertrigo 06/24/2020  . Overweight (BMI 25.0-29.9) 09/24/2020   Resolved Ambulatory Problems    Diagnosis Date Noted  . Elevated liver enzymes 11/05/2015  . Obesity 12/02/2015   Past Medical History:  Diagnosis Date  . Allergy   . Diabetes mellitus without complication ([place])   . Hyperlipidemia   . Hypertension      Review of Systems  All other systems reviewed and are negative.      Objective:   Physical Exam Vitals reviewed.  Constitutional:      Appearance: Normal appearance.  Neck:     Vascular: No carotid bruit.  Cardiovascular:     Rate and Rhythm: Normal rate and regular rhythm.     Pulses: Normal pulses.     Heart sounds: Normal heart sounds.  Pulmonary:     Effort: Pulmonary effort is normal.     Breath sounds: Normal breath sounds.  Neurological:     General: No focal deficit present.     Mental Status: She is alert and oriented to person, place, and time.  Psychiatric:        Mood and Affect: Mood normal.           Assessment & Plan:  Marland KitchenMarland KitchenVelvilee was seen today for diabetes.  Diagnoses and all orders for this visit:  Uncontrolled type 2 diabetes mellitus  with hyperglycemia ([place]) -     POCT glycosylated hemoglobin (Hb [name]) -     Empagliflozin-metFORMIN HCl (SYNJARDY) 12.02-999 MG TABS; Take 1 tablet by mouth 2 (two) times daily. -     Semaglutide, 1 MG/DOSE, (OZEMPIC, 1 MG/DOSE,) 2 MG/1.5ML SOPN; Inject 1 mg into the skin once a week.  Essential hypertension, benign  Hyperlipidemia LDL goal <70  Overweight (BMI 25.0-29.9)   .Marland Kitchen Results for orders placed or performed in visit on 09/23/20  POCT glycosylated hemoglobin (Hb [name])  Result Value Ref Range   Hemoglobin [name] 7.2 (A) 4.0 - 5.6 %   HbA1c POC (<> result, manual entry)     HbA1c, POC (prediabetic range)     HbA1c, POC (controlled diabetic range)     [name] improved again. Almost to goal under 7.  Pt is going to continue ozempic and synjardy and get to goal under 7.  Use diet and exercise to get you to goal.  On ACE. BP to goal.  I do not see on statin. Sent msg to strongly encourage statin daily. It was sent one time but do not see an intolerance etc.  UTD foot and eye exam.  Declined flu and pneumonia shot.  covid vaccine done.    Pt needs pap smear.

## 2020-09-24 ENCOUNTER — Telehealth: Payer: Self-pay | Admitting: Physician Assistant

## 2020-09-24 DIAGNOSIS — E663 Overweight: Secondary | ICD-10-CM | POA: Insufficient documentation

## 2020-09-24 NOTE — Telephone Encounter (Signed)
Looking over chart I do not see that you are on a statin. With diabetes goal LDL is under 70. Any reason why you would not want to be on a statin?

## 2020-09-24 NOTE — Telephone Encounter (Signed)
LMOM asking patient to call back and let us know if she would be willing to start cholesterol medication. Awaiting call back.

## 2020-11-27 ENCOUNTER — Other Ambulatory Visit: Payer: Self-pay | Admitting: Physician Assistant

## 2020-12-22 ENCOUNTER — Other Ambulatory Visit: Payer: Self-pay

## 2020-12-22 ENCOUNTER — Encounter: Payer: Self-pay | Admitting: Physician Assistant

## 2020-12-22 ENCOUNTER — Ambulatory Visit (INDEPENDENT_AMBULATORY_CARE_PROVIDER_SITE_OTHER): Payer: 59 | Admitting: Physician Assistant

## 2020-12-22 VITALS — BP 122/77 | HR 109 | Ht 64.0 in | Wt 156.0 lb

## 2020-12-22 DIAGNOSIS — E559 Vitamin D deficiency, unspecified: Secondary | ICD-10-CM | POA: Diagnosis not present

## 2020-12-22 DIAGNOSIS — E119 Type 2 diabetes mellitus without complications: Secondary | ICD-10-CM | POA: Diagnosis not present

## 2020-12-22 DIAGNOSIS — E785 Hyperlipidemia, unspecified: Secondary | ICD-10-CM | POA: Diagnosis not present

## 2020-12-22 DIAGNOSIS — I10 Essential (primary) hypertension: Secondary | ICD-10-CM

## 2020-12-22 DIAGNOSIS — E1165 Type 2 diabetes mellitus with hyperglycemia: Secondary | ICD-10-CM

## 2020-12-22 LAB — POCT GLYCOSYLATED HEMOGLOBIN (HGB A1C): Hemoglobin A1C: 6.6 % — AB (ref 4.0–5.6)

## 2020-12-22 MED ORDER — LISINOPRIL 10 MG PO TABS: 10.0000 mg | ORAL_TABLET | Freq: Every day | ORAL | 3 refills | Status: DC

## 2020-12-22 MED ORDER — OZEMPIC (1 MG/DOSE) 4 MG/3ML ~~LOC~~ SOPN: 1.0000 mg | PEN_INJECTOR | SUBCUTANEOUS | 0 refills | Status: DC

## 2020-12-22 NOTE — Progress Notes (Signed)
Subjective:    Patient ID: Alexandra [name], female    DOB: [birth year], 55 y.o.   MRN: [id number]  HPI  Pt is a 55 yo overweight female with T2DM, HTN, HLD who presents to the clinic for 3 month follow up.   Pt's last [name] was:  Lab Results  Component Value Date   HGBA1C 6.6 (A) 12/22/2020   She was almost to goal at last visit. Down another 12lbs.  Checking sugars in the morning and ranging 90-100.  She is staying active and keep a good portion controlled diet. She denies any hypoglycemic events. No open sores or wounds. Taking ozempic weekly and tolerating well.   Pt denies any cP, palpitations, headaches, or vision changes.    .. Active Ambulatory Problems    Diagnosis Date Noted  . Diabetes type 2, uncontrolled ([place]) 11/05/2015  . Hyperlipidemia LDL goal <70 11/05/2015  . Vitamin D deficiency 11/05/2015  . Essential hypertension, benign 12/02/2015  . Elevated hematocrit 06/24/2020  . Elevated red blood cell count 06/24/2020  . Intertrigo 06/24/2020  . Overweight (BMI 25.0-29.9) 09/24/2020   Resolved Ambulatory Problems    Diagnosis Date Noted  . Elevated liver enzymes 11/05/2015  . Obesity 12/02/2015   Past Medical History:  Diagnosis Date  . Allergy   . Diabetes mellitus without complication ([place])   . Hyperlipidemia   . Hypertension        Review of Systems  All other systems reviewed and are negative.      Objective:   Physical Exam Vitals reviewed.  Constitutional:      Appearance: Normal appearance.  Neck:     Vascular: No carotid bruit.  Cardiovascular:     Rate and Rhythm: Normal rate and regular rhythm.     Pulses: Normal pulses.     Heart sounds: Normal heart sounds.  Pulmonary:     Effort: Pulmonary effort is normal.     Breath sounds: Normal breath sounds.  Musculoskeletal:     Right lower leg: No edema.     Left lower leg: No edema.  Lymphadenopathy:     Cervical: No cervical adenopathy.  Neurological:     General: No focal deficit  present.     Mental Status: She is alert and oriented to person, place, and time.  Psychiatric:        Mood and Affect: Mood normal.    .. Results for orders placed or performed in visit on 12/22/20  POCT glycosylated hemoglobin (Hb [name])  Result Value Ref Range   Hemoglobin [name] 6.6 (A) 4.0 - 5.6 %   HbA1c POC (<> result, manual entry)     HbA1c, POC (prediabetic range)     HbA1c, POC (controlled diabetic range)            Assessment & Plan:  Marland KitchenMarland KitchenVelvilee was seen today for diabetes.  Diagnoses and all orders for this visit:  Controlled type 2 diabetes mellitus without complication, without long-term current use of insulin (HCC) -     POCT glycosylated hemoglobin (Hb [name]) -     Semaglutide, 1 MG/DOSE, (OZEMPIC, 1 MG/DOSE,) 4 MG/3ML SOPN; Inject 1 mg into the skin once a week. -     COMPLETE METABOLIC PANEL WITH GFR  Essential hypertension, benign -     lisinopril (ZESTRIL) 10 MG tablet; Take 1 tablet (10 mg total) by mouth daily. -     COMPLETE METABOLIC PANEL WITH GFR  Hyperlipidemia LDL goal <70 -     Lipid  Panel w/reflex Direct LDL  Vitamin D deficiency -     VITAMIN D 25 Hydroxy (Vit-D Deficiency, Fractures)   Last [name] is 6.6 great news.  Continue ozempic.  BP to goal. On ACE. Not on statin. Discussed importance of LDL under 70. Lipid ordered.  Foot and eye exam UTD.  Pt declines flu shot.  covid and pneuomnia UTD.

## 2021-01-19 ENCOUNTER — Inpatient Hospital Stay: Payer: 59 | Admitting: Hematology & Oncology

## 2021-01-19 ENCOUNTER — Telehealth: Payer: Self-pay

## 2021-01-19 ENCOUNTER — Inpatient Hospital Stay: Payer: 59

## 2021-01-19 NOTE — Telephone Encounter (Signed)
Pt called in to r/s her appt and ok with seeing sarah to get a sooner appt   Alexandra [name]

## 2021-02-03 ENCOUNTER — Encounter: Payer: Self-pay | Admitting: Physician Assistant

## 2021-02-05 NOTE — Telephone Encounter (Signed)
Submitted a quantity exception form through covermymeds. Reference number [id number].

## 2021-02-06 ENCOUNTER — Inpatient Hospital Stay: Payer: 59 | Admitting: Family

## 2021-02-06 ENCOUNTER — Inpatient Hospital Stay: Payer: 59

## 2021-02-07 LAB — COMPLETE METABOLIC PANEL WITH GFR
AG Ratio: 1.5 (calc) (ref 1.0–2.5)
ALT: 14 U/L (ref 6–29)
AST: 16 U/L (ref 10–35)
Albumin: 4.5 g/dL (ref 3.6–5.1)
Alkaline phosphatase (APISO): 97 U/L (ref 37–153)
BUN: 13 mg/dL (ref 7–25)
CO2: 27 mmol/L (ref 20–32)
Calcium: 10.1 mg/dL (ref 8.6–10.4)
Chloride: 105 mmol/L (ref 98–110)
Creat: 0.85 mg/dL (ref 0.50–1.05)
GFR, Est African American: 89 mL/min/{1.73_m2} (ref >=60)
GFR, Est Non African American: 77 mL/min/{1.73_m2} (ref >=60)
Globulin: 3 g/dL (calc) (ref 1.9–3.7)
Glucose, Bld: 100 mg/dL — ABNORMAL HIGH (ref 65–99)
Potassium: 4.5 mmol/L (ref 3.5–5.3)
Sodium: 143 mmol/L (ref 135–146)
Total Bilirubin: 0.6 mg/dL (ref 0.2–1.2)
Total Protein: 7.5 g/dL (ref 6.1–8.1)

## 2021-02-07 LAB — LIPID PANEL W/REFLEX DIRECT LDL
Cholesterol: 188 mg/dL (ref <200)
HDL: 72 mg/dL (ref >=50)
LDL Cholesterol (Calc): 101 mg/dL (calc) — ABNORMAL HIGH
Non-HDL Cholesterol (Calc): 116 mg/dL (calc) (ref <130)
Total CHOL/HDL Ratio: 2.6 (calc) (ref <5.0)
Triglycerides: 63 mg/dL (ref <150)

## 2021-02-07 LAB — VITAMIN D 25 HYDROXY (VIT D DEFICIENCY, FRACTURES): Vit D, 25-Hydroxy: 28 ng/mL — ABNORMAL LOW (ref 30–100)

## 2021-02-09 NOTE — Progress Notes (Signed)
Evette,   HDL look great.  LDL not to goal of under 70 but at 100. We should start a low dose statin to help decrease any risk of CV event. Are you ok with this?  Kidney, liver look great.  Vitamin D low. Start 2000 units D3 OTC daily.

## 2021-02-11 ENCOUNTER — Other Ambulatory Visit: Payer: Self-pay | Admitting: Physician Assistant

## 2021-02-11 DIAGNOSIS — E119 Type 2 diabetes mellitus without complications: Secondary | ICD-10-CM

## 2021-02-23 ENCOUNTER — Inpatient Hospital Stay (HOSPITAL_BASED_OUTPATIENT_CLINIC_OR_DEPARTMENT_OTHER): Payer: 59 | Admitting: Family

## 2021-02-23 ENCOUNTER — Encounter: Payer: Self-pay | Admitting: Family

## 2021-02-23 ENCOUNTER — Telehealth: Payer: Self-pay

## 2021-02-23 ENCOUNTER — Inpatient Hospital Stay: Payer: 59 | Attending: Family

## 2021-02-23 ENCOUNTER — Other Ambulatory Visit: Payer: Self-pay

## 2021-02-23 VITALS — BP 125/84 | HR 87 | Temp 97.9°F | Resp 16 | Wt 155.0 lb

## 2021-02-23 DIAGNOSIS — D509 Iron deficiency anemia, unspecified: Secondary | ICD-10-CM

## 2021-02-23 DIAGNOSIS — I10 Essential (primary) hypertension: Secondary | ICD-10-CM | POA: Insufficient documentation

## 2021-02-23 DIAGNOSIS — D469 Myelodysplastic syndrome, unspecified: Secondary | ICD-10-CM | POA: Insufficient documentation

## 2021-02-23 DIAGNOSIS — Z807 Family history of other malignant neoplasms of lymphoid, hematopoietic and related tissues: Secondary | ICD-10-CM | POA: Insufficient documentation

## 2021-02-23 DIAGNOSIS — Z79899 Other long term (current) drug therapy: Secondary | ICD-10-CM | POA: Insufficient documentation

## 2021-02-23 DIAGNOSIS — E611 Iron deficiency: Secondary | ICD-10-CM | POA: Insufficient documentation

## 2021-02-23 DIAGNOSIS — Z1589 Genetic susceptibility to other disease: Secondary | ICD-10-CM

## 2021-02-23 DIAGNOSIS — R42 Dizziness and giddiness: Secondary | ICD-10-CM | POA: Insufficient documentation

## 2021-02-23 DIAGNOSIS — Z801 Family history of malignant neoplasm of trachea, bronchus and lung: Secondary | ICD-10-CM | POA: Insufficient documentation

## 2021-02-23 DIAGNOSIS — R718 Other abnormality of red blood cells: Secondary | ICD-10-CM

## 2021-02-23 LAB — CBC WITH DIFFERENTIAL (CANCER CENTER ONLY)
Abs Immature Granulocytes: 0.01 10*3/uL (ref 0.00–0.07)
Basophils Absolute: 0.1 10*3/uL (ref 0.0–0.1)
Basophils Relative: 1 %
Eosinophils Absolute: 0.2 10*3/uL (ref 0.0–0.5)
Eosinophils Relative: 3 %
HCT: 42.9 % (ref 36.0–46.0)
Hemoglobin: 14 g/dL (ref 12.0–15.0)
Immature Granulocytes: 0 %
Lymphocytes Relative: 32 %
Lymphs Abs: 2.2 10*3/uL (ref 0.7–4.0)
MCH: 23.9 pg — ABNORMAL LOW (ref 26.0–34.0)
MCHC: 32.6 g/dL (ref 30.0–36.0)
MCV: 73.1 fL — ABNORMAL LOW (ref 80.0–100.0)
Monocytes Absolute: 0.6 10*3/uL (ref 0.1–1.0)
Monocytes Relative: 9 %
Neutro Abs: 3.9 10*3/uL (ref 1.7–7.7)
Neutrophils Relative %: 55 %
Platelet Count: 294 10*3/uL (ref 150–400)
RBC: 5.87 MIL/uL — ABNORMAL HIGH (ref 3.87–5.11)
RDW: 14.2 % (ref 11.5–15.5)
WBC Count: 7 10*3/uL (ref 4.0–10.5)
nRBC: 0 % (ref 0.0–0.2)

## 2021-02-23 LAB — CMP (CANCER CENTER ONLY)
ALT: 14 U/L (ref 0–44)
AST: 16 U/L (ref 15–41)
Albumin: 4.4 g/dL (ref 3.5–5.0)
Alkaline Phosphatase: 84 U/L (ref 38–126)
Anion gap: 5 (ref 5–15)
BUN: 14 mg/dL (ref 6–20)
CO2: 29 mmol/L (ref 22–32)
Calcium: 10.4 mg/dL — ABNORMAL HIGH (ref 8.9–10.3)
Chloride: 99 mmol/L (ref 98–111)
Creatinine: 0.86 mg/dL (ref 0.44–1.00)
GFR, Estimated: >60 mL/min (ref >60)
Glucose, Bld: 102 mg/dL — ABNORMAL HIGH (ref 70–99)
Potassium: 4.3 mmol/L (ref 3.5–5.1)
Sodium: 133 mmol/L — ABNORMAL LOW (ref 135–145)
Total Bilirubin: 0.5 mg/dL (ref 0.3–1.2)
Total Protein: 7.8 g/dL (ref 6.5–8.1)

## 2021-02-23 LAB — IRON AND TIBC
Iron: 83 ug/dL (ref 41–142)
Saturation Ratios: 27 % (ref 21–57)
TIBC: 305 ug/dL (ref 236–444)
UIBC: 222 ug/dL (ref 120–384)

## 2021-02-23 LAB — LACTATE DEHYDROGENASE: LDH: 114 U/L (ref 98–192)

## 2021-02-23 LAB — FERRITIN: Ferritin: 215 ng/mL (ref 11–307)

## 2021-02-23 LAB — SAVE SMEAR(SSMR), FOR PROVIDER SLIDE REVIEW

## 2021-02-23 NOTE — Telephone Encounter (Signed)
-----   Message from Eliezer Bottom, NP sent at 02/23/2021  2:47 PM EDT ----- Iron studies look great! Keep up the good work!   ----- Message ----- From: Engineer, structural, Lab [facility] [place]: 02/23/2021   9:00 AM EDT To: Eliezer Bottom, NP

## 2021-02-23 NOTE — Progress Notes (Signed)
Hematology and Oncology Follow Up Visit  Alexandra [name] [id number] [birth year] 55 y.o. 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 She is doing quite well and has no complaints at this time.  No fever, chills, n/v, cough, rash, SOB, chest pain, palpitations, abdominal pain or changes in bowel or bladder habits.  No bleeding, bruising or petechiae.  She has occasional dizziness when she leans over or stands too quickly. She takes Lisinopril for HTN.  No swelling, tenderness, numbness or tingling in her extremities.  No falls or syncope.  She has maintained a good appetite and is staying well hydrated. Her weight is stable.   ECOG Performance Status: 1 - Symptomatic but completely ambulatory  Medications:  Allergies as of 02/23/2021   No Known Allergies     Medication List       Accurate as of Feb 23, 2021  9:01 AM. If you have any questions, ask your nurse or doctor.        AMBULATORY NON FORMULARY MEDICATION Glucometer, test strips, and lancets   AMBULATORY NON FORMULARY MEDICATION Toujeo pen needles   Bayer Contour Next Monitor w/Device Kit Check fasting blood sugar once daily. Diagnosis Type 2 Diabetes ICD 10 E11.65   Bayer Microlet Lancets lancets CHECK FASTING BLOOD SUGAR ONCE DAILY   blood glucose meter kit and supplies Contour Next per pt request/ Use up to four times daily as directed. (FOR ICD-10 E10.9, E11.9).   clotrimazole-betamethasone cream Commonly known as: LOTRISONE Apply 1 application topically 2 (two) times daily.   Contour Next Test test strip Generic drug: glucose blood Use as instructed   FreeStyle Libre 14 Day Reader Kerrin Mo 1 applicator by Does not apply route every 14 (fourteen) days.   FreeStyle Libre 14 Day Sensor Misc 1 Device by Does not apply route  every 14 (fourteen) days.   lisinopril 10 MG tablet Commonly known as: ZESTRIL Take 1 tablet (10 mg total) by mouth daily.   multivitamin tablet Take 1 tablet by mouth daily.   NovoFine Plus 32G X 4 MM Misc Generic drug: Insulin Pen Needle USE AS DIRECTED FOR TOUJEO   Ozempic (1 MG/DOSE) 4 MG/3ML Sopn Generic drug: Semaglutide (1 MG/DOSE) INJECT $RemoveBeforeD'1MG'EmbJWScPcgWbku$  UNDER THE SKIN EVERY WEEK   Synjardy 12.02-999 MG Tabs Generic drug: Empagliflozin-metFORMIN HCl Take 1 tablet by mouth 2 (two) times daily.   Vitamin D (Ergocalciferol) 1.25 MG (50000 UNIT) Caps capsule Commonly known as: DRISDOL TAKE 1 CAPSULE (50,000 UNITS TOTAL) BY MOUTH EVERY 7 (SEVEN) DAYS.       Allergies: No Known Allergies  Past Medical History, Surgical history, Social history, and Family History were reviewed and updated.  Review of Systems: All other 10 point review of systems is negative.   Physical Exam:  vitals were not taken for this visit.   Wt Readings from Last 3 Encounters:  12/22/20 156 lb (70.8 kg)  09/23/20 168 lb (76.2 kg)  07/21/20 178 lb 12.8 oz (81.1 kg)    Ocular: Sclerae unicteric, pupils equal, round and reactive to light Ear-nose-throat: Oropharynx clear, dentition fair Lymphatic: No cervical or supraclavicular adenopathy Lungs no rales or rhonchi, good excursion bilaterally Heart regular rate and rhythm, no murmur appreciated Abd soft, nontender, positive bowel sounds MSK no focal spinal tenderness, no joint edema Neuro: non-focal, well-oriented, appropriate affect Breasts: Deferred  Lab Results  Component Value Date   WBC 7.0 02/23/2021   HGB 14.0 02/23/2021   HCT 42.9 02/23/2021   MCV 73.1 (L) 02/23/2021   PLT 294 02/23/2021   Lab Results  Component Value Date   FERRITIN 187 03/11/2020   IRON 55 07/21/2020   TIBC 304 07/21/2020   UIBC 249 07/21/2020   IRONPCTSAT 18 (L) 07/21/2020   Lab Results  Component Value Date   RETICCTPCT 1.1 07/21/2020   RBC 5.87 (H)  02/23/2021   RETICCTABS 58,590 03/19/2020   No results found for: KPAFRELGTCHN, LAMBDASER, KAPLAMBRATIO No results found for: IGGSERUM, IGA, IGMSERUM No results found for: Odetta Pink, SPEI   Chemistry      Component Value Date/Time   NA 143 02/06/2021 1146   K 4.5 02/06/2021 1146   CL 105 02/06/2021 1146   CO2 27 02/06/2021 1146   BUN 13 02/06/2021 1146   CREATININE 0.85 02/06/2021 1146      Component Value Date/Time   CALCIUM 10.1 02/06/2021 1146   ALKPHOS 96 07/21/2020 1428   AST 16 02/06/2021 1146   AST 19 07/21/2020 1428   ALT 14 02/06/2021 1146   ALT 22 07/21/2020 1428   BILITOT 0.6 02/06/2021 1146   BILITOT 0.4 07/21/2020 1428       Impression and Plan: Alexandra [name] is a very pleasant 55 yo female with JAK 2 DNMT3Ap.Arg19G - a tier QQV:ZDGLOVF of unknown significance and mild iron deficiency.  She is doing well and has no complaints.  Iron studies are pending. She will increase her intake of iron rich foods if needed.  Follow-up in another year.  She can contact our office with any questions or concerns.    Laverna Peace, NP 5/9/20229:01 AM

## 2021-02-23 NOTE — Telephone Encounter (Signed)
Called and informed patient of lab results, patient verbalized understanding and denies any questions or concerns at this time.   

## 2021-03-25 ENCOUNTER — Encounter: Payer: Self-pay | Admitting: Physician Assistant

## 2021-03-25 ENCOUNTER — Ambulatory Visit (INDEPENDENT_AMBULATORY_CARE_PROVIDER_SITE_OTHER): Payer: 59 | Admitting: Physician Assistant

## 2021-03-25 VITALS — BP 121/78 | HR 84 | Ht 64.0 in | Wt 153.0 lb

## 2021-03-25 DIAGNOSIS — I10 Essential (primary) hypertension: Secondary | ICD-10-CM

## 2021-03-25 DIAGNOSIS — E785 Hyperlipidemia, unspecified: Secondary | ICD-10-CM

## 2021-03-25 DIAGNOSIS — E119 Type 2 diabetes mellitus without complications: Secondary | ICD-10-CM | POA: Diagnosis not present

## 2021-03-25 DIAGNOSIS — E559 Vitamin D deficiency, unspecified: Secondary | ICD-10-CM

## 2021-03-25 DIAGNOSIS — E1165 Type 2 diabetes mellitus with hyperglycemia: Secondary | ICD-10-CM

## 2021-03-25 LAB — POCT GLYCOSYLATED HEMOGLOBIN (HGB A1C): Hemoglobin A1C: 6.3 % — AB (ref 4.0–5.6)

## 2021-03-25 MED ORDER — OZEMPIC (1 MG/DOSE) 4 MG/3ML ~~LOC~~ SOPN: PEN_INJECTOR | SUBCUTANEOUS | 0 refills | Status: DC

## 2021-03-25 MED ORDER — VITAMIN D (ERGOCALCIFEROL) 1.25 MG (50000 UNIT) PO CAPS: 50000.0000 [IU] | ORAL_CAPSULE | ORAL | 3 refills | Status: AC

## 2021-03-25 MED ORDER — ATORVASTATIN CALCIUM 10 MG PO TABS: 10.0000 mg | ORAL_TABLET | Freq: Every day | ORAL | 3 refills | Status: DC

## 2021-03-25 MED ORDER — SYNJARDY 12.5-1000 MG PO TABS: 1.0000 | ORAL_TABLET | Freq: Two times a day (BID) | ORAL | 0 refills | Status: DC

## 2021-03-25 NOTE — Progress Notes (Signed)
   Subjective:    Patient ID: Alexandra [name], female    DOB: 1966/01/02, 55 y.o.   MRN: [id number]  HPI Pt is a 55 yo female with T2DM, HTN, HLD, vit D deficiency who presents to the clinic for medications refills.   Not currently checking sugars because not refilled libre. Compliant with synjardy and ozempic. No side effects. Exercising and continues to lose weight. No open sores, hypoglycemia, CP, palpitations, dizziness or headaches.   .. Active Ambulatory Problems    Diagnosis Date Noted   Diabetes type 2, uncontrolled ([place]) 11/05/2015   Hyperlipidemia LDL goal <70 11/05/2015   Vitamin D deficiency 11/05/2015   Essential hypertension, benign 12/02/2015   Elevated hematocrit 06/24/2020   Elevated red blood cell count 06/24/2020   Intertrigo 06/24/2020   Overweight (BMI 25.0-29.9) 09/24/2020   Resolved Ambulatory Problems    Diagnosis Date Noted   Elevated liver enzymes 11/05/2015   Obesity 12/02/2015   Past Medical History:  Diagnosis Date   Allergy    Diabetes mellitus without complication ([place])    Hyperlipidemia    Hypertension      Review of Systems  All other systems reviewed and are negative.     Objective:   Physical Exam Vitals reviewed.  Constitutional:      Appearance: Normal appearance.  HENT:     Head: Normocephalic.  Neck:     Vascular: No carotid bruit.  Cardiovascular:     Rate and Rhythm: Normal rate and regular rhythm.     Pulses: Normal pulses.     Heart sounds: Normal heart sounds.  Pulmonary:     Effort: Pulmonary effort is normal.     Breath sounds: Normal breath sounds.  Neurological:     General: No focal deficit present.     Mental Status: She is alert and oriented to person, place, and time.  Psychiatric:        Mood and Affect: Mood normal.      .. Lab Results  Component Value Date   HGBA1C 6.3 (A) 03/25/2021       Assessment & Plan:  Marland KitchenMarland KitchenVelvilee was seen today for diabetes.  Diagnoses and all orders for this  visit:  Controlled type 2 diabetes mellitus without complication, without long-term current use of insulin (HCC) -     POCT glycosylated hemoglobin (Hb [name]) -     atorvastatin (LIPITOR) 10 MG tablet; Take 1 tablet (10 mg total) by mouth daily. -     Semaglutide, 1 MG/DOSE, (OZEMPIC, 1 MG/DOSE,) 4 MG/3ML SOPN; INJECT 1MG  UNDER THE SKIN EVERY WEEK  Uncontrolled type 2 diabetes mellitus with hyperglycemia (HCC) -     Empagliflozin-metFORMIN HCl (SYNJARDY) 12.02-999 MG TABS; Take 1 tablet by mouth 2 (two) times daily.  Essential hypertension, benign  Hyperlipidemia LDL goal <70 -     atorvastatin (LIPITOR) 10 MG tablet; Take 1 tablet (10 mg total) by mouth daily.  Vitamin D deficiency -     Vitamin D, Ergocalciferol, (DRISDOL) 1.25 MG (50000 UNIT) CAPS capsule; Take 1 capsule (50,000 Units total) by mouth every 7 (seven) days.  [name] to goal.  Continue on same medications.  On ACE. BP to goal.  Not on statin. Discussed LDL goal. Added lipitor. Discussed side effects.  Eye exam/foot exam UTD.  Declines covid/flu/pneumonia vaccines.  Follow up in 3 months.

## 2021-04-06 ENCOUNTER — Other Ambulatory Visit: Payer: Self-pay | Admitting: Physician Assistant

## 2021-04-06 DIAGNOSIS — L304 Erythema intertrigo: Secondary | ICD-10-CM

## 2021-06-15 ENCOUNTER — Telehealth: Payer: 59 | Admitting: Physician Assistant

## 2021-06-15 DIAGNOSIS — R058 Other specified cough: Secondary | ICD-10-CM

## 2021-06-15 DIAGNOSIS — J310 Chronic rhinitis: Secondary | ICD-10-CM

## 2021-06-15 MED ORDER — PROMETHAZINE-DM 6.25-15 MG/5ML PO SYRP: 5.0000 mL | ORAL_SOLUTION | Freq: Four times a day (QID) | ORAL | 0 refills | Status: DC | PRN

## 2021-06-15 MED ORDER — IPRATROPIUM BROMIDE 0.03 % NA SOLN
2.0000 | Freq: Two times a day (BID) | NASAL | 0 refills | Status: DC
Start: 2021-06-15 — End: 2021-07-01

## 2021-06-15 NOTE — Progress Notes (Signed)
Virtual Visit Consent   Alexandra [name], you are scheduled for a virtual visit with a [facility] provider today.     Just as with appointments in the office, your consent must be obtained to participate.  Your consent will be active for this visit and any virtual visit you may have with one of our providers in the next 365 days.     If you have a MyChart account, a copy of this consent can be sent to you electronically.  All virtual visits are billed to your insurance company just like a traditional visit in the office.    As this is a virtual visit, video technology does not allow for your provider to perform a traditional examination.  This may limit your provider's ability to fully assess your condition.  If your provider identifies any concerns that need to be evaluated in person or the need to arrange testing (such as labs, EKG, etc.), we will make arrangements to do so.     Although advances in technology are sophisticated, we cannot ensure that it will always work on either your end or our end.  If the connection with a video visit is poor, the visit may have to be switched to a telephone visit.  With either a video or telephone visit, we are not always able to ensure that we have a secure connection.     I need to obtain your verbal consent now.   Are you willing to proceed with your visit today?    Alexandra [name] has provided verbal consent on 06/15/2021 for a virtual visit (video or telephone).   Alexandra [name], [place]   Date: 06/15/2021 10:40 AM   Virtual Visit via Video Note   I, Alexandra [name], connected with  Alexandra [name]  ([id number], Jan 21, 1966) on 06/15/21 at 10:15 AM EDT by a video-enabled telemedicine application and verified that I am speaking with the correct person using two identifiers.  Location: Patient: Virtual Visit Location Patient: Home Provider: Virtual Visit Location Provider: Home Office   I discussed the limitations of evaluation and management by  telemedicine and the availability of in person appointments. The patient expressed understanding and agreed to proceed.    History of Present Illness: Alexandra [name] is a 55 y.o. who identifies as a female who was assigned female at birth, and is being seen today for lingering cough and PND after a cold a week ago. Notes initially with scratchy throat and rhinorrhea 8+ days ago. Though it was related to allergies so started OTC antihistamine with some improvement. Then noted increased congestion and development of a dry cough. Was COVID tested and negative. Notes she has been taking OTC cough medication and antihistamine with most of her symptoms resolving but is still left with a dry cough, worse at night. Denies fever, chills, chest pain or SOB.   HPI: HPI  Problems:  Patient Active Problem List   Diagnosis Date Noted   Overweight (BMI 25.0-29.9) 09/24/2020   Elevated hematocrit 06/24/2020   Elevated red blood cell count 06/24/2020   Intertrigo 06/24/2020   Essential hypertension, benign 12/02/2015   Diabetes type 2, uncontrolled ([place]) 11/05/2015   Hyperlipidemia LDL goal <70 11/05/2015   Vitamin D deficiency 11/05/2015    Allergies: No Known Allergies Medications:  Current Outpatient Medications:    ipratropium (ATROVENT) 0.03 % nasal spray, Place 2 sprays into both nostrils every 12 (twelve) hours., Disp: 30 mL, Rfl: 0   promethazine-dextromethorphan (PROMETHAZINE-DM) 6.25-15  MG/5ML syrup, Take 5 mLs by mouth 4 (four) times daily as needed for cough., Disp: 118 mL, Rfl: 0   AMBULATORY NON FORMULARY MEDICATION, Glucometer, test strips, and lancets, Disp: 100 each, Rfl: 0   AMBULATORY NON FORMULARY MEDICATION, Toujeo pen needles, Disp: 200 each, Rfl: 3   atorvastatin (LIPITOR) 10 MG tablet, Take 1 tablet (10 mg total) by mouth daily., Disp: 90 tablet, Rfl: 3   BAYER MICROLET LANCETS lancets, CHECK FASTING BLOOD SUGAR ONCE DAILY, Disp: 100 each, Rfl: 0   blood glucose meter kit and  supplies, Contour Next per pt request/ Use up to four times daily as directed. (FOR ICD-10 E10.9, E11.9)., Disp: 1 each, Rfl: 0   Blood Glucose Monitoring Suppl (BAYER CONTOUR NEXT MONITOR) w/Device KIT, Check fasting blood sugar once daily. Diagnosis Type 2 Diabetes ICD 10 E11.65, Disp: 1 kit, Rfl: each   clotrimazole-betamethasone (LOTRISONE) cream, APPLY TO AFFECTED AREA TWICE A DAY, Disp: 45 g, Rfl: 1   Continuous Blood Gluc Receiver (FREESTYLE LIBRE 14 DAY READER) DEVI, 1 applicator by Does not apply route every 14 (fourteen) days., Disp: 1 each, Rfl: 0   Continuous Blood Gluc Sensor (FREESTYLE LIBRE 14 DAY SENSOR) MISC, 1 Device by Does not apply route every 14 (fourteen) days., Disp: 2 each, Rfl: 11   Empagliflozin-metFORMIN HCl (SYNJARDY) 12.02-999 MG TABS, Take 1 tablet by mouth 2 (two) times daily., Disp: 180 tablet, Rfl: 0   glucose blood (CONTOUR NEXT TEST) test strip, Use as instructed, Disp: 100 each, Rfl: 12   lisinopril (ZESTRIL) 10 MG tablet, Take 1 tablet (10 mg total) by mouth daily., Disp: 90 tablet, Rfl: 3   NOVOFINE PLUS 32G X 4 MM MISC, USE AS DIRECTED FOR TOUJEO, Disp: 200 each, Rfl: 2   Semaglutide, 1 MG/DOSE, (OZEMPIC, 1 MG/DOSE,) 4 MG/3ML SOPN, INJECT 1MG UNDER THE SKIN EVERY WEEK, Disp: 9 mL, Rfl: 0   Vitamin D, Ergocalciferol, (DRISDOL) 1.25 MG (50000 UNIT) CAPS capsule, Take 1 capsule (50,000 Units total) by mouth every 7 (seven) days., Disp: 12 capsule, Rfl: 3  Observations/Objective: Patient is well-developed, well-nourished in no acute distress.  Resting comfortably at home.  Head is normocephalic, atraumatic.  No labored breathing. Speech is clear and coherent with logical content.  Patient is alert and oriented at baseline.   Assessment and Plan: 1. Post-viral cough syndrome - promethazine-dextromethorphan (PROMETHAZINE-DM) 6.25-15 MG/5ML syrup; Take 5 mLs by mouth 4 (four) times daily as needed for cough.  Dispense: 118 mL; Refill: 0  2. Chronic rhinitis -  ipratropium (ATROVENT) 0.03 % nasal spray; Place 2 sprays into both nostrils every 12 (twelve) hours.  Dispense: 30 mL; Refill: 0 Recent viral URI mixed with allergic rhinitis. Lingering dry cough and some PND. Continue antihistamine. Will start Atrovent nasal spray and promethazine-dm cough syrup. Supportive measures and OTC medications reviewed. Follow-up with PCP if not resolving.   Follow Up Instructions: I discussed the assessment and treatment plan with the patient. The patient was provided an opportunity to ask questions and all were answered. The patient agreed with the plan and demonstrated an understanding of the instructions.  A copy of instructions were sent to the patient via MyChart.  The patient was advised to call back or seek an in-person evaluation if the symptoms worsen or if the condition fails to improve as anticipated.  Time:  I spent 12 minutes with the patient via telehealth technology discussing the above problems/concerns.    Alexandra Rio, PA-C

## 2021-06-15 NOTE — Patient Instructions (Signed)
Alexandra [name], thank you for joining Leeanne Rio, PA-C for today's virtual visit.  While this provider is not your primary care provider (PCP), if your PCP is located in our provider database this encounter information will be shared with them immediately following your visit.  Consent: (Patient) Alexandra [name] provided verbal consent for this virtual visit at the beginning of the encounter.  Current Medications:  Current Outpatient Medications:    AMBULATORY NON FORMULARY MEDICATION, Glucometer, test strips, and lancets, Disp: 100 each, Rfl: 0   AMBULATORY NON FORMULARY MEDICATION, Toujeo pen needles, Disp: 200 each, Rfl: 3   atorvastatin (LIPITOR) 10 MG tablet, Take 1 tablet (10 mg total) by mouth daily., Disp: 90 tablet, Rfl: 3   BAYER MICROLET LANCETS lancets, CHECK FASTING BLOOD SUGAR ONCE DAILY, Disp: 100 each, Rfl: 0   blood glucose meter kit and supplies, Contour Next per pt request/ Use up to four times daily as directed. (FOR ICD-10 E10.9, E11.9)., Disp: 1 each, Rfl: 0   Blood Glucose Monitoring Suppl (BAYER CONTOUR NEXT MONITOR) w/Device KIT, Check fasting blood sugar once daily. Diagnosis Type 2 Diabetes ICD 10 E11.65, Disp: 1 kit, Rfl: each   clotrimazole-betamethasone (LOTRISONE) cream, APPLY TO AFFECTED AREA TWICE A DAY, Disp: 45 g, Rfl: 1   Continuous Blood Gluc Receiver (FREESTYLE LIBRE 14 DAY READER) DEVI, 1 applicator by Does not apply route every 14 (fourteen) days., Disp: 1 each, Rfl: 0   Continuous Blood Gluc Sensor (FREESTYLE LIBRE 14 DAY SENSOR) MISC, 1 Device by Does not apply route every 14 (fourteen) days., Disp: 2 each, Rfl: 11   Empagliflozin-metFORMIN HCl (SYNJARDY) 12.02-999 MG TABS, Take 1 tablet by mouth 2 (two) times daily., Disp: 180 tablet, Rfl: 0   glucose blood (CONTOUR NEXT TEST) test strip, Use as instructed, Disp: 100 each, Rfl: 12   lisinopril (ZESTRIL) 10 MG tablet, Take 1 tablet (10 mg total) by mouth daily., Disp: 90 tablet, Rfl: 3    Multiple Vitamin (MULTIVITAMIN) tablet, Take 1 tablet by mouth daily., Disp: , Rfl:    NOVOFINE PLUS 32G X 4 MM MISC, USE AS DIRECTED FOR TOUJEO, Disp: 200 each, Rfl: 2   Semaglutide, 1 MG/DOSE, (OZEMPIC, 1 MG/DOSE,) 4 MG/3ML SOPN, INJECT $RemoveBefo'1MG'hYWOWAPUSrL$  UNDER THE SKIN EVERY WEEK, Disp: 9 mL, Rfl: 0   Vitamin D, Ergocalciferol, (DRISDOL) 1.25 MG (50000 UNIT) CAPS capsule, Take 1 capsule (50,000 Units total) by mouth every 7 (seven) days., Disp: 12 capsule, Rfl: 3   Medications ordered in this encounter:  No orders of the defined types were placed in this encounter.    *If you need refills on other medications prior to your next appointment, please contact your pharmacy*  Follow-Up: Call back or seek an in-person evaluation if the symptoms worsen or if the condition fails to improve as anticipated.  Other Instructions Please keep well-hydrated and get plenty of rest. Continue your Claritin as directed. Start a saline nasal rinse 1-2 x daily.  After morning use of saline rinse, use the Atrovent nasal spray as directed. Take the cough syrup as directed. If symptoms are not improving, anything worsens or new symptoms develop, you need to follow-up with your PCP.    If you have been instructed to have an in-person evaluation today at a local Urgent Care facility, please use the link below. It will take you to a list of all of our available [facility], including address, phone number and hours of operation. Please do not delay care.  [facility]  If you or a family member do not have a primary care provider, use the link below to schedule a visit and establish care. When you choose a [facility] primary care physician or advanced practice provider, you gain a long-term partner in health. Find a Primary Care Provider  Learn more about [facility] in-office and virtual care options: [facility]

## 2021-06-21 ENCOUNTER — Other Ambulatory Visit: Payer: Self-pay | Admitting: Physician Assistant

## 2021-06-21 DIAGNOSIS — E1165 Type 2 diabetes mellitus with hyperglycemia: Secondary | ICD-10-CM

## 2021-07-01 ENCOUNTER — Ambulatory Visit (INDEPENDENT_AMBULATORY_CARE_PROVIDER_SITE_OTHER): Payer: 59 | Admitting: Physician Assistant

## 2021-07-01 ENCOUNTER — Encounter: Payer: Self-pay | Admitting: Physician Assistant

## 2021-07-01 VITALS — BP 97/66 | HR 93 | Temp 98.6°F | Ht 64.0 in | Wt 151.0 lb

## 2021-07-01 DIAGNOSIS — I10 Essential (primary) hypertension: Secondary | ICD-10-CM

## 2021-07-01 DIAGNOSIS — E785 Hyperlipidemia, unspecified: Secondary | ICD-10-CM | POA: Diagnosis not present

## 2021-07-01 DIAGNOSIS — E119 Type 2 diabetes mellitus without complications: Secondary | ICD-10-CM | POA: Diagnosis not present

## 2021-07-01 DIAGNOSIS — E1165 Type 2 diabetes mellitus with hyperglycemia: Secondary | ICD-10-CM | POA: Diagnosis not present

## 2021-07-01 LAB — POCT GLYCOSYLATED HEMOGLOBIN (HGB A1C): Hemoglobin A1C: 6.3 % — AB (ref 4.0–5.6)

## 2021-07-01 MED ORDER — LISINOPRIL 10 MG PO TABS: 5.0000 mg | ORAL_TABLET | Freq: Every day | ORAL | 3 refills | Status: DC

## 2021-07-01 MED ORDER — OZEMPIC (1 MG/DOSE) 4 MG/3ML ~~LOC~~ SOPN: PEN_INJECTOR | SUBCUTANEOUS | 0 refills | Status: DC

## 2021-07-01 MED ORDER — SYNJARDY 12.5-1000 MG PO TABS: 1.0000 | ORAL_TABLET | Freq: Two times a day (BID) | ORAL | 0 refills | Status: DC

## 2021-07-01 NOTE — Progress Notes (Signed)
   Subjective:    Patient ID: Alexandra [name], female    DOB: Aug 05, 1966, 55 y.o.   MRN: [id number]  HPI Declined flu   Talk about shingrix  6.3 today BP good.    Review of Systems     Objective:   Physical Exam        Assessment & Plan:  Marland KitchenMarland KitchenVelvilee was seen today for diabetes, hypertension and hyperlipidemia.  Diagnoses and all orders for this visit:  Uncontrolled type 2 diabetes mellitus with hyperglycemia ([place]) -     POCT glycosylated hemoglobin (Hb [name]) -     Empagliflozin-metFORMIN HCl (SYNJARDY) 12.02-999 MG TABS; Take 1 tablet by mouth 2 (two) times daily.  Essential hypertension, benign -     lisinopril (ZESTRIL) 10 MG tablet; Take 0.5 tablets (5 mg total) by mouth daily.  Hyperlipidemia LDL goal <70  Controlled type 2 diabetes mellitus without complication, without long-term current use of insulin (HCC) -     Semaglutide, 1 MG/DOSE, (OZEMPIC, 1 MG/DOSE,) 4 MG/3ML SOPN; INJECT '1MG'$  UNDER THE SKIN EVERY WEEK  [name] stable and to goal.  Continue on ozempic and synjardy.  On ACE. BP too low. Cut down to '5mg'$  of lisinopril.  Strongly recommended a statin. Pt states she is willing to try it.  Foot exam UtD.  Needs eye exam.  Declines flu and pneumonia.  Covid vaccine x3. Needs booster.  Follow up in 3 months.

## 2021-07-01 NOTE — Patient Instructions (Signed)
Cut back to '5mg'$  of lisinopril.

## 2021-07-08 ENCOUNTER — Other Ambulatory Visit: Payer: Self-pay | Admitting: Physician Assistant

## 2021-07-08 ENCOUNTER — Encounter: Payer: Self-pay | Admitting: Physician Assistant

## 2021-07-09 MED ORDER — FREESTYLE LIBRE 14 DAY SENSOR MISC
0 refills | Status: DC
Start: 2021-07-09 — End: 2021-11-20

## 2021-07-22 ENCOUNTER — Other Ambulatory Visit: Payer: Self-pay | Admitting: Physician Assistant

## 2021-07-22 DIAGNOSIS — E119 Type 2 diabetes mellitus without complications: Secondary | ICD-10-CM

## 2021-09-02 IMAGING — MG DIGITAL SCREENING BILAT W/ TOMO W/ CAD
6 of 12 series · 6 of 36 positions shown · non-contrast
Comparison: Previous exam(s).

ACR Breast Density Category a: The breast tissue is almost entirely
fatty.

CLINICAL DATA: Screening.

EXAM:
DIGITAL SCREENING BILATERAL MAMMOGRAM WITH TOMO AND CAD

[R CC synth-2D]
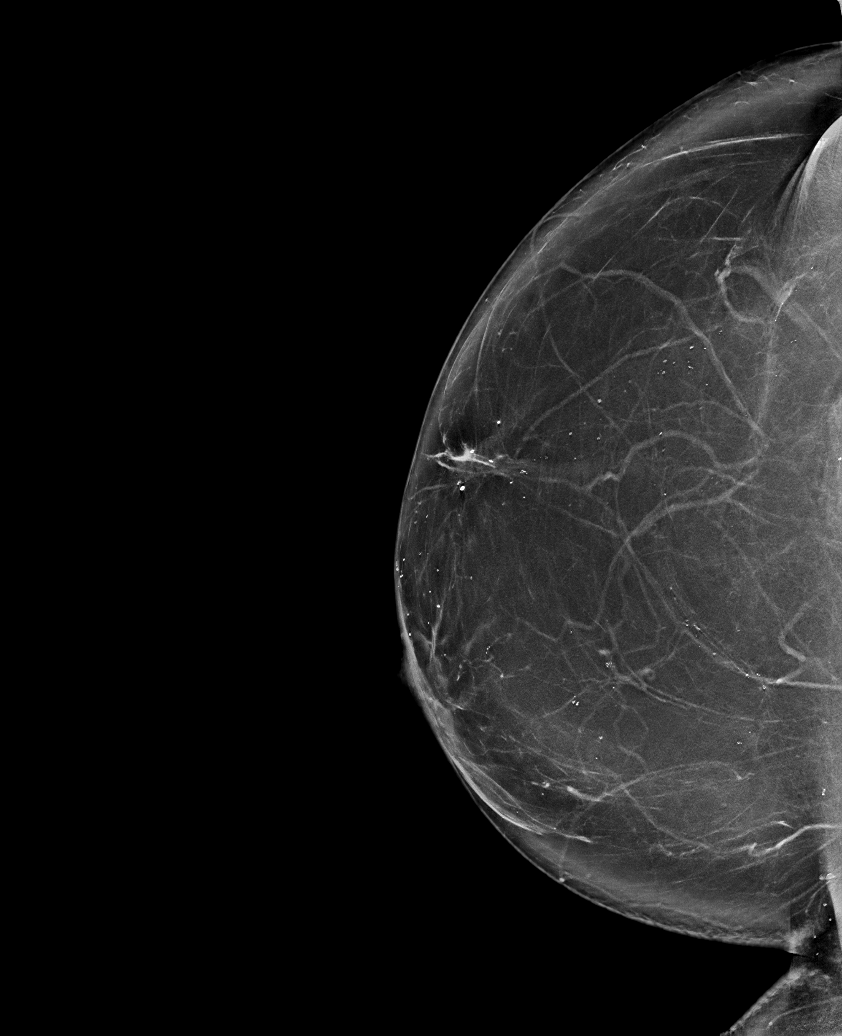

[R XCCL synth-2D]
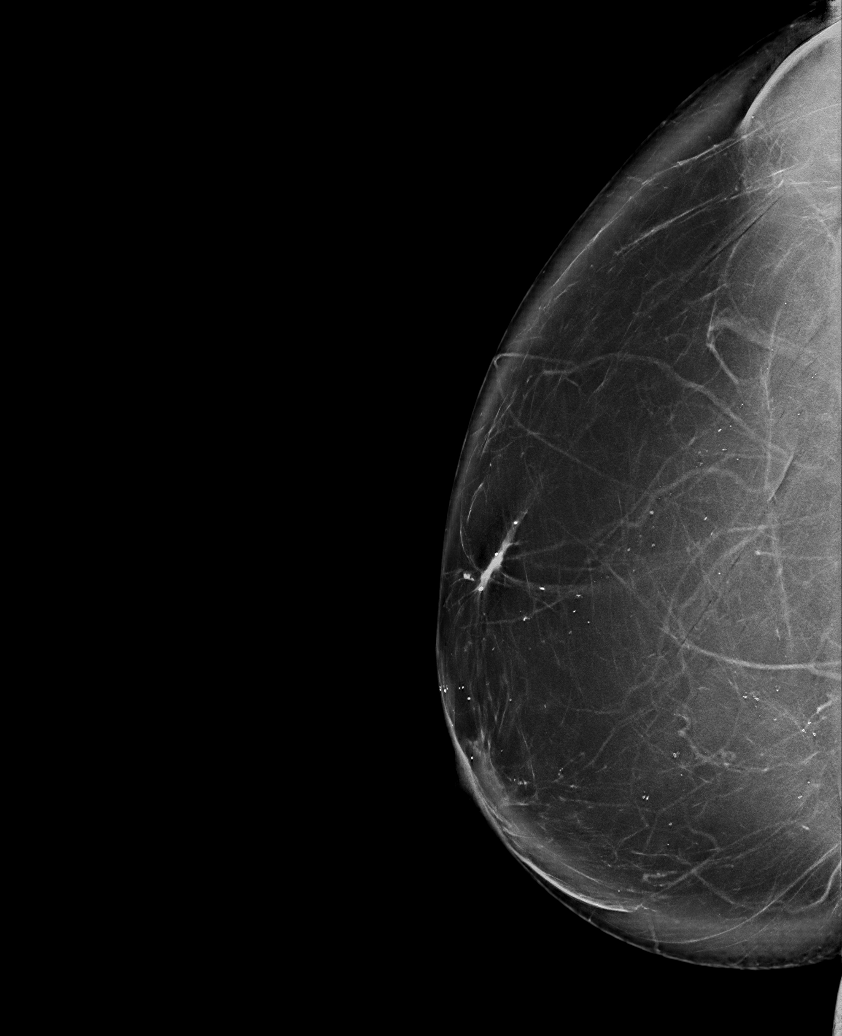

[R MLO synth-2D]
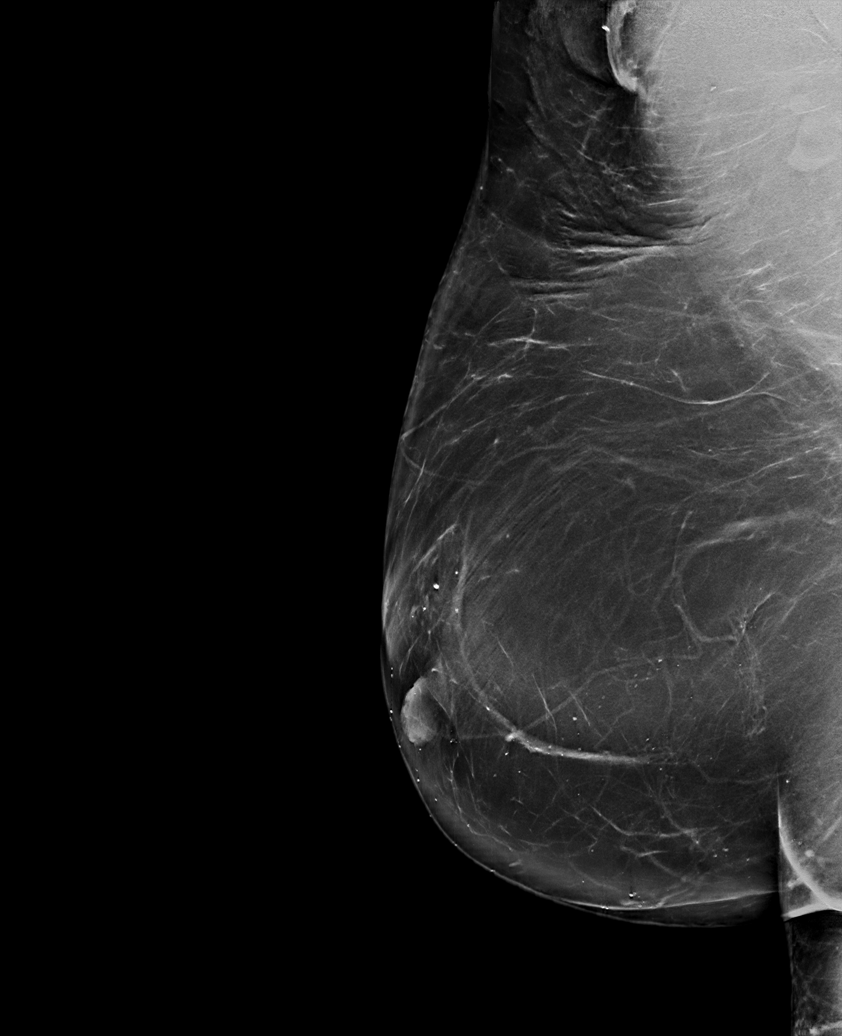

[L XCCL synth-2D]
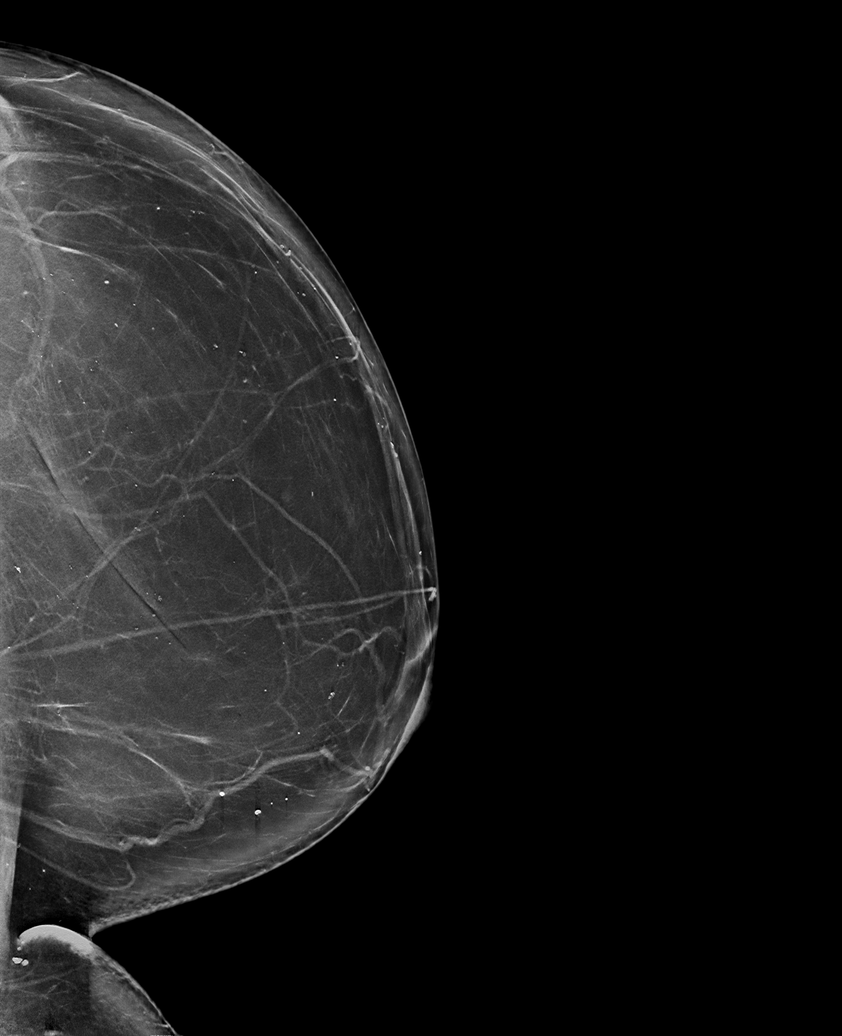

[L CC synth-2D]
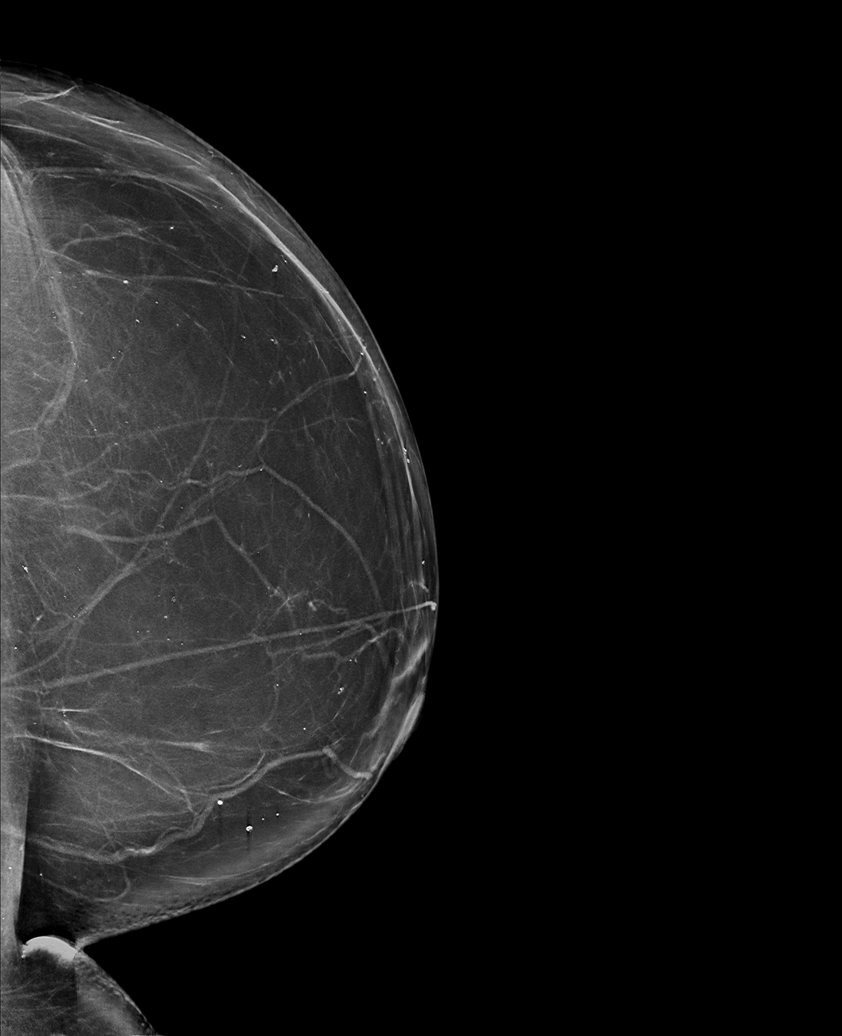

[L MLO synth-2D]
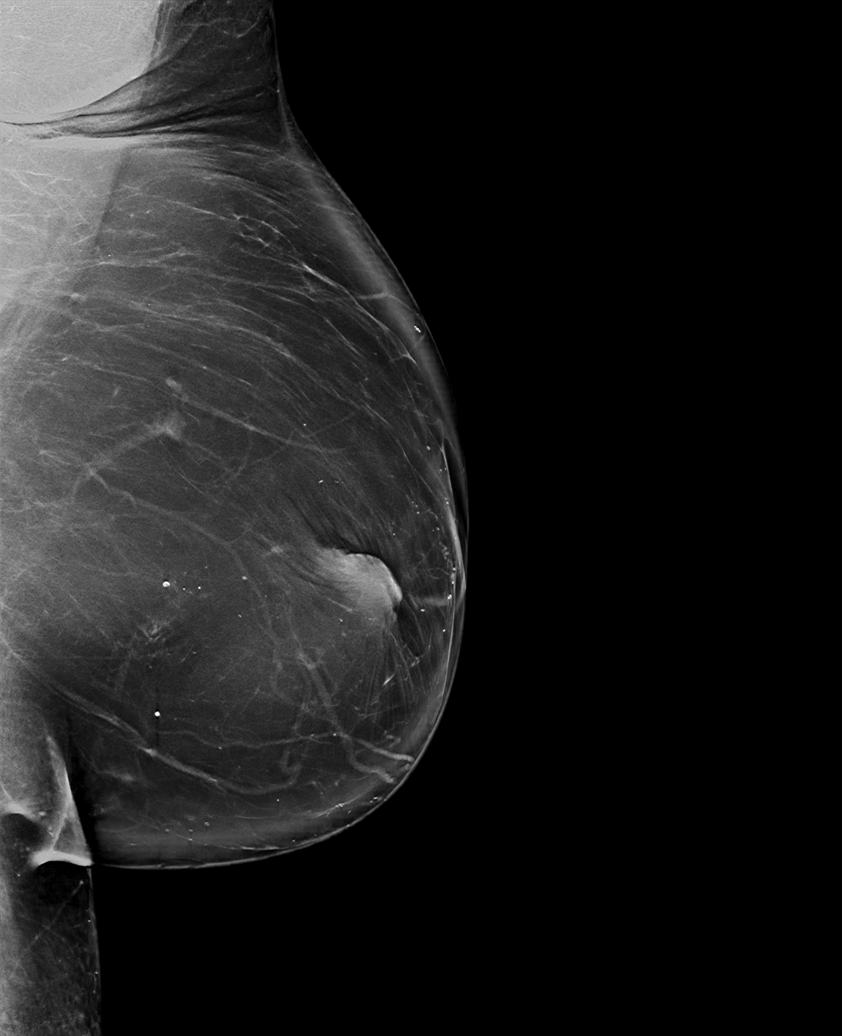

[6 of 36 positions shown; findings below may reference images not displayed]

FINDINGS: There are no findings suspicious for malignancy. Images were
processed with CAD.
IMPRESSION: No mammographic evidence of malignancy. A result letter of this
screening mammogram will be mailed directly to the patient.

RECOMMENDATION:
Screening mammogram in one year. (Code:[id number])

BI-RADS CATEGORY  1: Negative.

## 2021-09-07 ENCOUNTER — Encounter: Payer: Self-pay | Admitting: Physician Assistant

## 2021-09-25 ENCOUNTER — Other Ambulatory Visit: Payer: Self-pay

## 2021-09-25 ENCOUNTER — Encounter: Payer: Self-pay | Admitting: Physician Assistant

## 2021-09-25 DIAGNOSIS — J310 Chronic rhinitis: Secondary | ICD-10-CM

## 2021-09-25 MED ORDER — IPRATROPIUM BROMIDE 0.03 % NA SOLN: 2.0000 | Freq: Two times a day (BID) | NASAL | 0 refills | Status: DC

## 2021-10-06 ENCOUNTER — Encounter: Payer: Self-pay | Admitting: Physician Assistant

## 2021-10-14 ENCOUNTER — Ambulatory Visit: Payer: 59 | Admitting: Physician Assistant

## 2021-10-21 ENCOUNTER — Encounter: Payer: Self-pay | Admitting: Physician Assistant

## 2021-10-22 ENCOUNTER — Telehealth: Payer: Self-pay

## 2021-10-22 NOTE — Telephone Encounter (Signed)
Medication: Continuous Blood Gluc Sensor (FREESTYLE LIBRE 14 DAY SENSOR) MISC Prior authorization submitted via CoverMyMeds on 10/22/2021 PA submission pending

## 2021-10-22 NOTE — Telephone Encounter (Signed)
Medication: Empagliflozin-metFORMIN HCl (SYNJARDY) 12.02-999 MG TABS Prior authorization submitted via CoverMyMeds on 10/22/2021 PA submission pending

## 2021-10-22 NOTE — Telephone Encounter (Signed)
Medication: OZEMPIC, 1 MG/DOSE, 4 MG/3ML SOPN Prior authorization submitted via CoverMyMeds on 10/22/2021 PA submission pending

## 2021-10-22 NOTE — Telephone Encounter (Signed)
Spoke with pt on the phone to verify which meds she needed PA's for. Ongoing communication will be via [employer]

## 2021-10-22 NOTE — Telephone Encounter (Signed)
Medication:  Empagliflozin-metFORMIN HCl (SYNJARDY) 12.02-999 MG TABS Prior authorization determination received Medication has been approved Approval dates: 10/22/2021-10/22/2022  Patient aware via: [place] aware: Yes Provider aware via this encounter

## 2021-10-24 NOTE — Telephone Encounter (Signed)
Medication: Continuous Blood Gluc Sensor (FREESTYLE LIBRE 14 DAY SENSOR) MISC Prior authorization determination received Medication has been denied Reason for denial:  "There is no indication your patient is on any of the following treatment programs: 1. insulin regimen which includes long-acting (basal) insulin and rapid-acting (prandial/mealtime) insulin; 2. multiple daily injections of U-500 insulins; or 3. continuous subcutaneous external insulin pump. Freestyle Libre 10 day and 14 day sensors and reader are considered medically necessary for the management of type 1 or type 2 diabetes mellitus when your patient is age 57 years or older and either of the following treatment programs: 1. insulin regimen which includes long-acting (basal) insulin and rapid-acting (prandial/mealtime) insulin, or multiple daily injections of U-500 insulin; or . 2. continuous subcutaneous external insulin pump."

## 2021-10-24 NOTE — Telephone Encounter (Signed)
Medication: OZEMPIC, 1 MG/DOSE, 4 MG/3ML SOPN Prior authorization determination received Medication has been denied Reason for denial:  "There is nothing to support that for each of the following 2 covered alternatives: Byetta or Bydureon, Trulicity - either: 1) The individual has had inadequate efficacy or contraindication according to FDA label or significant intolerance to the covered alternative; or 2) The individual is not a candidate for the covered alternative due to being subject to a warning per the prescribing information (labeling), having a disease characteristic, individual clinical factor(s), or other attributes/conditions or is unable to administer and requires this dosage formulation Ozempic (semaglutide) is considered medically necessary when for both of the following covered alternatives: a. Byetta or Bydureon, b. Trulicity - documentation that either: A. The individual has had inadequate efficacy or contraindication according to FDA label or significant intolerance to the covered alternative; or B. The individual is not a candidate for the covered alternative due to being subject to a warning per the prescribing information (labeling), having a disease characteristic, individual clinical factor[s], or other attributes/conditions or is unable to administer and requires this dosage formulation."

## 2021-11-06 ENCOUNTER — Encounter: Payer: Self-pay | Admitting: Physician Assistant

## 2021-11-11 NOTE — Telephone Encounter (Signed)
Patient called and left a vm trying to figure out what she should do since Ozempic denied. Please advise?

## 2021-11-11 NOTE — Telephone Encounter (Signed)
Did she text [phone] for ozempic if she is commerical it should be approved no matter what. She just has to answer a few questions.

## 2021-11-11 NOTE — Telephone Encounter (Signed)
Mychart message sent to patient in separate encounter.

## 2021-11-12 NOTE — Telephone Encounter (Signed)
Patient called and left a vm about this as well. [employer] won't work unless [employer] administrator this, since it was denied we could send for appeal or do you want to change to something else?

## 2021-11-13 ENCOUNTER — Ambulatory Visit: Payer: 59 | Admitting: Physician Assistant

## 2021-11-20 ENCOUNTER — Encounter: Payer: Self-pay | Admitting: Physician Assistant

## 2021-11-20 ENCOUNTER — Other Ambulatory Visit: Payer: Self-pay

## 2021-11-20 ENCOUNTER — Ambulatory Visit (INDEPENDENT_AMBULATORY_CARE_PROVIDER_SITE_OTHER): Payer: Managed Care, Other (non HMO) | Admitting: Physician Assistant

## 2021-11-20 VITALS — BP 92/68 | HR 82 | Ht 64.0 in | Wt 154.0 lb

## 2021-11-20 DIAGNOSIS — Z23 Encounter for immunization: Secondary | ICD-10-CM

## 2021-11-20 DIAGNOSIS — E785 Hyperlipidemia, unspecified: Secondary | ICD-10-CM

## 2021-11-20 DIAGNOSIS — I10 Essential (primary) hypertension: Secondary | ICD-10-CM | POA: Diagnosis not present

## 2021-11-20 DIAGNOSIS — E119 Type 2 diabetes mellitus without complications: Secondary | ICD-10-CM | POA: Diagnosis not present

## 2021-11-20 DIAGNOSIS — E1165 Type 2 diabetes mellitus with hyperglycemia: Secondary | ICD-10-CM

## 2021-11-20 LAB — POCT GLYCOSYLATED HEMOGLOBIN (HGB A1C): Hemoglobin A1C: 6.3 % — AB (ref 4.0–5.6)

## 2021-11-20 MED ORDER — LISINOPRIL 2.5 MG PO TABS: 2.5000 mg | ORAL_TABLET | Freq: Every day | ORAL | 0 refills | Status: DC

## 2021-11-20 MED ORDER — SYNJARDY 12.5-1000 MG PO TABS: 1.0000 | ORAL_TABLET | Freq: Two times a day (BID) | ORAL | 0 refills | Status: DC

## 2021-11-20 NOTE — Progress Notes (Signed)
Subjective:    Patient ID: Alexandra [name], female    DOB: 10/12/1966, 56 y.o.   MRN: [id number]  HPI Pt is a 56 yo female with T2DM, HTN, HLD who presents to the clinic for 3 month follow up.   Pt is doing well. She is taking her medication daily. She is checking her sugars and staying around 100 in the mornings. No CP, palpitations, headaches, or vision changes. No hypoglycemic events. No open sores or wounds.   .. Active Ambulatory Problems    Diagnosis Date Noted   Diabetes type 2, uncontrolled 11/05/2015   Hyperlipidemia LDL goal <70 11/05/2015   Vitamin D deficiency 11/05/2015   Essential hypertension, benign 12/02/2015   Elevated hematocrit 06/24/2020   Elevated red blood cell count 06/24/2020   Intertrigo 06/24/2020   Overweight (BMI 25.0-29.9) 09/24/2020   Controlled type 2 diabetes mellitus without complication, without long-term current use of insulin ([place]) 11/20/2021   Resolved Ambulatory Problems    Diagnosis Date Noted   Elevated liver enzymes 11/05/2015   Obesity 12/02/2015   Past Medical History:  Diagnosis Date   Allergy    Diabetes mellitus without complication ([place])    Hyperlipidemia    Hypertension     Review of Systems  All other systems reviewed and are negative.     Objective:   Physical Exam Vitals reviewed.  Constitutional:      Appearance: Normal appearance.  HENT:     Head: Normocephalic.  Neck:     Vascular: No carotid bruit.  Cardiovascular:     Rate and Rhythm: Normal rate and regular rhythm.     Pulses: Normal pulses.     Heart sounds: Normal heart sounds.  Pulmonary:     Effort: Pulmonary effort is normal.     Breath sounds: Normal breath sounds.  Musculoskeletal:     Right lower leg: No edema.     Left lower leg: No edema.  Neurological:     General: No focal deficit present.     Mental Status: She is alert and oriented to person, place, and time.  Psychiatric:        Mood and Affect: Mood normal.  .. Results for orders  placed or performed in visit on 11/20/21  POCT glycosylated hemoglobin (Hb [name])  Result Value Ref Range   Hemoglobin [name] 6.3 (A) 4.0 - 5.6 %   HbA1c POC (<> result, manual entry)     HbA1c, POC (prediabetic range)     HbA1c, POC (controlled diabetic range)             Assessment & Plan:  Marland KitchenMarland KitchenVelvilee was seen today for diabetes.  Diagnoses and all orders for this visit:  Controlled type 2 diabetes mellitus without complication, without long-term current use of insulin (HCC) -     POCT glycosylated hemoglobin (Hb [name]) -     Empagliflozin-metFORMIN HCl (SYNJARDY) 12.02-999 MG TABS; Take 1 tablet by mouth 2 (two) times daily.  Need for shingles vaccine -     Varicella-zoster vaccine IM (Shingrix)  Essential hypertension, benign -     lisinopril (ZESTRIL) 2.5 MG tablet; Take 1 tablet (2.5 mg total) by mouth daily.  Hyperlipidemia LDL goal <70  Other orders -     Discontinue: lisinopril (ZESTRIL) 2.5 MG tablet; Take 1 tablet (2.5 mg total) by mouth daily.   [name] looks great. Continue on same medications Cut lisionpril in half due to low BP.  On satin Needs eye exam Foot exam UTD. Covid  x3. Needs booster Flu and pneumonia vaccine UTD.  First shingrix today.  Follow up in 3 months.

## 2021-11-20 NOTE — Patient Instructions (Signed)
Decrease lisinopril to 2.5mg   Continue rest of medication.

## 2021-11-23 ENCOUNTER — Encounter: Payer: Self-pay | Admitting: Physician Assistant

## 2021-11-26 ENCOUNTER — Other Ambulatory Visit: Payer: Self-pay | Admitting: Physician Assistant

## 2021-11-26 DIAGNOSIS — E119 Type 2 diabetes mellitus without complications: Secondary | ICD-10-CM

## 2021-11-30 MED ORDER — LEVOCETIRIZINE DIHYDROCHLORIDE 5 MG PO TABS: 5.0000 mg | ORAL_TABLET | Freq: Every evening | ORAL | 3 refills | Status: DC

## 2021-11-30 NOTE — Addendum Note (Signed)
Addended by: Donella Stade on: 11/30/2021 01:44 PM   Modules accepted: Orders

## 2021-12-07 ENCOUNTER — Telehealth: Payer: Self-pay

## 2021-12-07 NOTE — Telephone Encounter (Addendum)
Initiated Prior authorization [id number] 4mg /72ml Via: Covermymeds Case/Key:[id number] Status: approved  as of 12/07/21 Reason:start Date:12/22/2021;Coverage End Date:12/22/2022; Notified Pt via: Mychart

## 2021-12-08 ENCOUNTER — Encounter: Payer: Self-pay | Admitting: Physician Assistant

## 2021-12-08 MED ORDER — XIGDUO XR 10-1000 MG PO TB24: 1.0000 | ORAL_TABLET | Freq: Every day | ORAL | 0 refills | Status: DC

## 2021-12-08 MED ORDER — TRULICITY 1.5 MG/0.5ML ~~LOC~~ SOAJ: 1.5000 mg | SUBCUTANEOUS | 0 refills | Status: DC

## 2021-12-08 NOTE — Addendum Note (Signed)
Addended by: Donella Stade on: 12/08/2021 04:58 PM   Modules accepted: Orders

## 2021-12-09 MED ORDER — ONETOUCH ULTRA VI STRP
ORAL_STRIP | 12 refills | Status: DC
Start: 2021-12-09 — End: 2024-05-28

## 2021-12-09 MED ORDER — LANCETS 30G MISC: 99 refills | Status: DC

## 2021-12-09 MED ORDER — ONETOUCH ULTRA MINI W/DEVICE KIT: PACK | 0 refills | Status: DC

## 2021-12-09 NOTE — Addendum Note (Signed)
Addended by: Narda Rutherford on: 12/09/2021 07:55 AM   Modules accepted: Orders

## 2021-12-21 ENCOUNTER — Other Ambulatory Visit: Payer: Self-pay | Admitting: Physician Assistant

## 2021-12-21 DIAGNOSIS — E119 Type 2 diabetes mellitus without complications: Secondary | ICD-10-CM

## 2022-01-07 ENCOUNTER — Other Ambulatory Visit: Payer: Self-pay | Admitting: Physician Assistant

## 2022-01-07 DIAGNOSIS — E119 Type 2 diabetes mellitus without complications: Secondary | ICD-10-CM

## 2022-01-18 LAB — HM DIABETES EYE EXAM

## 2022-01-28 ENCOUNTER — Encounter: Payer: Self-pay | Admitting: Physician Assistant

## 2022-02-24 ENCOUNTER — Ambulatory Visit: Payer: Commercial Managed Care - HMO | Admitting: Physician Assistant

## 2022-02-24 ENCOUNTER — Inpatient Hospital Stay: Payer: Commercial Managed Care - HMO | Admitting: Family

## 2022-02-24 ENCOUNTER — Inpatient Hospital Stay: Payer: Commercial Managed Care - HMO

## 2022-02-24 ENCOUNTER — Encounter: Payer: Self-pay | Admitting: Physician Assistant

## 2022-02-24 VITALS — BP 106/78 | HR 98 | Ht 64.0 in | Wt 169.0 lb

## 2022-02-24 DIAGNOSIS — E559 Vitamin D deficiency, unspecified: Secondary | ICD-10-CM | POA: Diagnosis not present

## 2022-02-24 DIAGNOSIS — Z1159 Encounter for screening for other viral diseases: Secondary | ICD-10-CM

## 2022-02-24 DIAGNOSIS — Z79899 Other long term (current) drug therapy: Secondary | ICD-10-CM | POA: Diagnosis not present

## 2022-02-24 DIAGNOSIS — E119 Type 2 diabetes mellitus without complications: Secondary | ICD-10-CM

## 2022-02-24 DIAGNOSIS — I10 Essential (primary) hypertension: Secondary | ICD-10-CM | POA: Diagnosis not present

## 2022-02-24 DIAGNOSIS — E785 Hyperlipidemia, unspecified: Secondary | ICD-10-CM

## 2022-02-24 DIAGNOSIS — Z1329 Encounter for screening for other suspected endocrine disorder: Secondary | ICD-10-CM

## 2022-02-24 DIAGNOSIS — Z23 Encounter for immunization: Secondary | ICD-10-CM | POA: Diagnosis not present

## 2022-02-24 LAB — POCT GLYCOSYLATED HEMOGLOBIN (HGB A1C): Hemoglobin A1C: 6.7 % — AB (ref 4.0–5.6)

## 2022-02-24 MED ORDER — XIGDUO XR 10-1000 MG PO TB24: 1.0000 | ORAL_TABLET | Freq: Every day | ORAL | 0 refills | Status: DC

## 2022-02-24 MED ORDER — TRULICITY 3 MG/0.5ML ~~LOC~~ SOAJ: 3.0000 mg | SUBCUTANEOUS | 0 refills | Status: DC

## 2022-02-24 NOTE — Progress Notes (Signed)
? ?Established Patient Office Visit ? ?Subjective   ?Patient ID: Alexandra [name], female    DOB: 10/23/1965  Age: 56 y.o. MRN: [id number] ? ?Chief Complaint  ?Patient presents with  ? Diabetes  ? Follow-up  ? ? ?HPI ?Pt is a 56 yo obese female with T2DM, HLD who presents to the clinic for follow up.  ? ?Alexandra [name] is very compliant with her medications. Alexandra [name] is having weight gain and rising sugars after having to switch to trulicty from ozempic. Alexandra [name] feels like it does not work as well. No open sores or wounds. No hypoglycemia. Alexandra [name] is checking her sugars and overall her average is 130s. No CP, palpitations, headaches, or vision changes.  ? ?.. ?Active Ambulatory Problems  ?  Diagnosis Date Noted  ? Diabetes type 2, uncontrolled 11/05/2015  ? Hyperlipidemia LDL goal <70 11/05/2015  ? Vitamin D deficiency 11/05/2015  ? Essential hypertension, benign 12/02/2015  ? Elevated hematocrit 06/24/2020  ? Elevated red blood cell count 06/24/2020  ? Intertrigo 06/24/2020  ? Overweight (BMI 25.0-29.9) 09/24/2020  ? Controlled type 2 diabetes mellitus without complication, without long-term current use of insulin ([place]) 11/20/2021  ? ?Resolved Ambulatory Problems  ?  Diagnosis Date Noted  ? Elevated liver enzymes 11/05/2015  ? Obesity 12/02/2015  ? ?Past Medical History:  ?Diagnosis Date  ? Allergy   ? Diabetes mellitus without complication ([place])   ? Hyperlipidemia   ? Hypertension   ? ? ? ?Review of Systems  ?All other systems reviewed and are negative. ? ?  ?Objective:  ?  ? ?BP 106/78   Pulse 98   Ht '5\' 4"'$  (1.626 m)   Wt 169 lb (76.7 kg)   LMP 09/19/2015 Comment: none since dec 2016  SpO2 99%   BMI 29.01 kg/m?  ?BP Readings from Last 3 Encounters:  ?02/24/22 106/78  ?11/20/21 92/68  ?07/01/21 97/66  ? ?Wt Readings from Last 3 Encounters:  ?02/24/22 169 lb (76.7 kg)  ?11/20/21 154 lb (69.9 kg)  ?07/01/21 151 lb (68.5 kg)  ? ?  ? ?Physical Exam ?Vitals reviewed.  ?Constitutional:   ?   Appearance: Normal appearance. Alexandra [name] is obese.   ?HENT:  ?   Head: Normocephalic.  ?Neck:  ?   Vascular: No carotid bruit.  ?Cardiovascular:  ?   Rate and Rhythm: Normal rate and regular rhythm.  ?   Pulses: Normal pulses.  ?   Heart sounds: Normal heart sounds.  ?Pulmonary:  ?   Effort: Pulmonary effort is normal.  ?   Breath sounds: Normal breath sounds.  ?Lymphadenopathy:  ?   Cervical: No cervical adenopathy.  ?Neurological:  ?   General: No focal deficit present.  ?   [name], place, and time.  ?Psychiatric:     ?   Mood and Affect: Mood normal.  ? ? ? ?Results for orders placed or performed in visit on 02/24/22  ?POCT glycosylated hemoglobin (Hb [name])  ?Result Value Ref Range  ? Hemoglobin [name] 6.7 (A) 4.0 - 5.6 %  ? HbA1c POC (<> result, manual entry)    ? HbA1c, POC (prediabetic range)    ? HbA1c, POC (controlled diabetic range)    ? ? ? ? ?The 10-year ASCVD risk score (Arnett DK, et al., 2019) is: 5.5% ? ?  ?Assessment & Plan:  ?..Alexandra [name] was seen today for diabetes and follow-up. ? ?Diagnoses and all orders for this visit: ? ?Controlled type 2 diabetes mellitus without  complication, without long-term current use of insulin (HCC) ?-     POCT glycosylated hemoglobin (Hb [name]) ?-     COMPLETE METABOLIC PANEL WITH GFR ?-     Dulaglutide (TRULICITY) 3 TO/6.7TI SOPN; Inject 3 mg as directed once a week. ?-     Dapagliflozin-metFORMIN HCl ER (XIGDUO XR) 07-999 MG TB24; Take 1 tablet by mouth daily with breakfast. ? ?Essential hypertension, benign ?-     COMPLETE METABOLIC PANEL WITH GFR ? ?Hyperlipidemia LDL goal <70 ?-     Lipid Panel w/reflex Direct LDL ? ?Vitamin D deficiency ?-     Vitamin D (25 hydroxy) ? ?Thyroid disorder screen ?-     TSH ? ?Medication management ?-     POCT glycosylated hemoglobin (Hb [name]) ?-     TSH ?-     Lipid Panel w/reflex Direct LDL ?-     COMPLETE METABOLIC PANEL WITH GFR ?-     CBC with Differential/Platelet ?-     Vitamin D (25 hydroxy) ? ?Encounter for hepatitis C screening test for  low risk patient ?-     Hepatitis C Antibody ? ?Need for shingles vaccine ?-     Varicella-zoster vaccine IM (Shingrix) ? ?[name] to goal but up from last check ?Numbers going up since having to switch to trulicity due to coverage would really love to switch back to ozempic ?Increased trulicity to '3mg'$  weekly ?Continue xigduo ?On statin ?BP to goal ?Eye exam at [facility] ?Foot exam UTD ?Shingrix dose 2 given today ?Covid/flu/pneumonia UTD ?Follow up in 3 months.  ? ?Encouraged to get pap smear ? ? ?Return in about 3 months (around 05/27/2022).  ? ? ?Iran Planas, PA-C ? ?

## 2022-02-24 NOTE — Patient Instructions (Signed)
[facility] in [place] ?

## 2022-02-26 ENCOUNTER — Encounter: Payer: Self-pay | Admitting: Physician Assistant

## 2022-03-05 ENCOUNTER — Other Ambulatory Visit: Payer: Self-pay | Admitting: Family

## 2022-03-05 ENCOUNTER — Inpatient Hospital Stay: Payer: Commercial Managed Care - HMO | Admitting: Family

## 2022-03-05 ENCOUNTER — Inpatient Hospital Stay: Payer: Commercial Managed Care - HMO | Attending: Hematology & Oncology

## 2022-03-05 DIAGNOSIS — Z1589 Genetic susceptibility to other disease: Secondary | ICD-10-CM

## 2022-03-05 DIAGNOSIS — D509 Iron deficiency anemia, unspecified: Secondary | ICD-10-CM

## 2022-03-08 ENCOUNTER — Encounter: Payer: Self-pay | Admitting: Physician Assistant

## 2022-03-09 ENCOUNTER — Encounter: Payer: Self-pay | Admitting: Physician Assistant

## 2022-03-10 NOTE — Telephone Encounter (Signed)
See previous message from pt regarding the same issue sent yesterday as well.  Charyl Bigger, CMA

## 2022-03-17 ENCOUNTER — Encounter: Payer: Self-pay | Admitting: Physician Assistant

## 2022-03-17 DIAGNOSIS — J310 Chronic rhinitis: Secondary | ICD-10-CM

## 2022-03-17 DIAGNOSIS — E785 Hyperlipidemia, unspecified: Secondary | ICD-10-CM

## 2022-03-17 DIAGNOSIS — I10 Essential (primary) hypertension: Secondary | ICD-10-CM

## 2022-03-17 DIAGNOSIS — E119 Type 2 diabetes mellitus without complications: Secondary | ICD-10-CM

## 2022-03-17 MED ORDER — XIGDUO XR 10-1000 MG PO TB24: 1.0000 | ORAL_TABLET | Freq: Every day | ORAL | 0 refills | Status: DC

## 2022-03-17 MED ORDER — LISINOPRIL 2.5 MG PO TABS: 2.5000 mg | ORAL_TABLET | Freq: Every day | ORAL | 1 refills | Status: DC

## 2022-03-17 MED ORDER — ATORVASTATIN CALCIUM 10 MG PO TABS: 10.0000 mg | ORAL_TABLET | Freq: Every day | ORAL | 3 refills | Status: DC

## 2022-03-17 MED ORDER — LEVOCETIRIZINE DIHYDROCHLORIDE 5 MG PO TABS: 5.0000 mg | ORAL_TABLET | Freq: Every evening | ORAL | 3 refills | Status: DC

## 2022-03-17 MED ORDER — IPRATROPIUM BROMIDE 0.03 % NA SOLN: 2.0000 | Freq: Two times a day (BID) | NASAL | 3 refills | Status: AC

## 2022-03-18 ENCOUNTER — Other Ambulatory Visit: Payer: Self-pay

## 2022-03-18 MED ORDER — SEMAGLUTIDE (1 MG/DOSE) 4 MG/3ML ~~LOC~~ SOPN: 1.0000 mg | PEN_INJECTOR | SUBCUTANEOUS | 0 refills | Status: DC

## 2022-03-19 MED ORDER — SEMAGLUTIDE (1 MG/DOSE) 4 MG/3ML ~~LOC~~ SOPN: 1.0000 mg | PEN_INJECTOR | SUBCUTANEOUS | 0 refills | Status: DC

## 2022-03-19 NOTE — Addendum Note (Signed)
Addended by: Fonnie Mu on: 03/19/2022 01:23 PM   Modules accepted: Orders

## 2022-03-22 ENCOUNTER — Encounter: Payer: Self-pay | Admitting: Physician Assistant

## 2022-03-22 DIAGNOSIS — E119 Type 2 diabetes mellitus without complications: Secondary | ICD-10-CM

## 2022-04-16 MED ORDER — FREESTYLE LIBRE 3 SENSOR MISC: 1.0000 | 1 refills | Status: DC

## 2022-05-19 ENCOUNTER — Encounter: Payer: Self-pay | Admitting: Neurology

## 2022-06-01 ENCOUNTER — Ambulatory Visit: Payer: Commercial Managed Care - HMO | Admitting: Physician Assistant

## 2022-06-18 ENCOUNTER — Other Ambulatory Visit: Payer: Self-pay | Admitting: Physician Assistant

## 2022-06-18 DIAGNOSIS — E119 Type 2 diabetes mellitus without complications: Secondary | ICD-10-CM

## 2022-07-02 ENCOUNTER — Encounter: Payer: Self-pay | Admitting: Physician Assistant

## 2022-07-02 ENCOUNTER — Ambulatory Visit: Payer: Commercial Managed Care - HMO | Admitting: Physician Assistant

## 2022-07-02 VITALS — BP 139/85 | HR 109 | Ht 64.0 in | Wt 166.0 lb

## 2022-07-02 DIAGNOSIS — E785 Hyperlipidemia, unspecified: Secondary | ICD-10-CM | POA: Diagnosis not present

## 2022-07-02 DIAGNOSIS — E119 Type 2 diabetes mellitus without complications: Secondary | ICD-10-CM | POA: Diagnosis not present

## 2022-07-02 DIAGNOSIS — Z1231 Encounter for screening mammogram for malignant neoplasm of breast: Secondary | ICD-10-CM

## 2022-07-02 DIAGNOSIS — I10 Essential (primary) hypertension: Secondary | ICD-10-CM

## 2022-07-02 LAB — POCT UA - MICROALBUMIN
Albumin/Creatinine Ratio, Urine, POC: <30
Creatinine, POC: 100 mg/dL
Microalbumin Ur, POC: 10 mg/L

## 2022-07-02 LAB — POCT GLYCOSYLATED HEMOGLOBIN (HGB A1C): HbA1c, POC (controlled diabetic range): 6.7 % (ref 0.0–7.0)

## 2022-07-02 MED ORDER — MOUNJARO 2.5 MG/0.5ML ~~LOC~~ SOAJ: 2.5000 mg | SUBCUTANEOUS | 0 refills | Status: DC

## 2022-07-02 MED ORDER — LISINOPRIL 2.5 MG PO TABS: 2.5000 mg | ORAL_TABLET | Freq: Every day | ORAL | 1 refills | Status: DC

## 2022-07-02 MED ORDER — XIGDUO XR 10-1000 MG PO TB24: 1.0000 | ORAL_TABLET | Freq: Every day | ORAL | 0 refills | Status: DC

## 2022-07-02 NOTE — Progress Notes (Signed)
Established Patient Office Visit  Subjective   Patient ID: ANJANI FEUERBORN, female    DOB: 1966/04/03  Age: 56 y.o. MRN: [id number]  Chief Complaint  Patient presents with   Diabetes    HPI Pt is a 56 yo female with T2DM, HTN, HLD who presents to the clinic for 3 month follow up.   Pt is not checking her sugars often. She did not tolerate trulicity and her insurance will not pay for ozempic and she has been eating more and feeling like her sugar is going up. Taking xigduo. No hypoglycemic events. No open sores or wounds. She denies any CP, palpitations, headaches or vision changes.  She is compliant with other medications.   .. Active Ambulatory Problems    Diagnosis Date Noted   Diabetes type 2, uncontrolled 11/05/2015   Hyperlipidemia LDL goal <70 11/05/2015   Vitamin D deficiency 11/05/2015   Essential hypertension, benign 12/02/2015   Elevated hematocrit 06/24/2020   Elevated red blood cell count 06/24/2020   Intertrigo 06/24/2020   Overweight (BMI 25.0-29.9) 09/24/2020   Controlled type 2 diabetes mellitus without complication, without long-term current use of insulin ([place]) 11/20/2021   Resolved Ambulatory Problems    Diagnosis Date Noted   Elevated liver enzymes 11/05/2015   Obesity 12/02/2015   Past Medical History:  Diagnosis Date   Allergy    Diabetes mellitus without complication ([place])    Hyperlipidemia    Hypertension      Review of Systems  All other systems reviewed and are negative.     Objective:     BP 139/85   Pulse (!) 109   Ht '5\' 4"'$  (1.626 m)   Wt 166 lb (75.3 kg)   LMP 09/19/2015 Comment: none since dec 2016  SpO2 100%   BMI 28.49 kg/m  BP Readings from Last 3 Encounters:  07/02/22 139/85  02/24/22 106/78  11/20/21 92/68   Wt Readings from Last 3 Encounters:  07/02/22 166 lb (75.3 kg)  02/24/22 169 lb (76.7 kg)  11/20/21 154 lb (69.9 kg)    .Marland Kitchen Results for orders placed or performed in visit on 07/02/22  POCT glycosylated  hemoglobin (Hb [name])  Result Value Ref Range   Hemoglobin [name]     HbA1c POC (<> result, manual entry)     HbA1c, POC (prediabetic range)     HbA1c, POC (controlled diabetic range) 6.7 0.0 - 7.0 %  POCT UA - Microalbumin  Result Value Ref Range   Microalbumin Ur, POC 10 mg/L   Creatinine, POC 100 mg/dL   Albumin/Creatinine Ratio, Urine, POC <30      Physical Exam Constitutional:      Appearance: Normal appearance.  HENT:     Head: Normocephalic.  Cardiovascular:     Rate and Rhythm: Normal rate and regular rhythm.     Pulses: Normal pulses.  Pulmonary:     Effort: Pulmonary effort is normal.     Breath sounds: Normal breath sounds.  Musculoskeletal:     Right lower leg: No edema.     Left lower leg: No edema.  Neurological:     General: No focal deficit present.  Psychiatric:        Mood and Affect: Mood normal.         Assessment & Plan:  .Delayla was seen today for diabetes.  Diagnoses and all orders for this visit:  Controlled type 2 diabetes mellitus without complication, without long-term current use of insulin ([place]) -  POCT glycosylated hemoglobin (Hb [name]) -     POCT UA - Microalbumin -     Discontinue: Dapagliflozin-metFORMIN HCl ER (XIGDUO XR) 07-999 MG TB24; Take 1 tablet by mouth daily with breakfast. -     Dapagliflozin-metFORMIN HCl ER (XIGDUO XR) 07-999 MG TB24; Take 1 tablet by mouth daily with breakfast. -     tirzepatide (MOUNJARO) 2.5 MG/0.5ML Pen; Inject 2.5 mg into the skin once a week.  Essential hypertension, benign -     Discontinue: lisinopril (ZESTRIL) 2.5 MG tablet; Take 1 tablet (2.5 mg total) by mouth daily. -     lisinopril (ZESTRIL) 2.5 MG tablet; Take 1 tablet (2.5 mg total) by mouth daily.  Hyperlipidemia LDL goal <70  Visit for screening mammogram -     MM 3D SCREEN BREAST BILATERAL; Future  Other orders -     Discontinue: tirzepatide (MOUNJARO) 2.5 MG/0.5ML Pen; Inject 2.5 mg into the skin once a week.   Pt agreed to  schedule pap for next visit.  Mammogram ordered.   [name] to goal but up some Continue xigduo Added mounjaro hoping due to failure of trulicity will get filled Normal microalbumin BP to goal, on ACE On statin Declined flu/covid/shingles .Marland Kitchen Diabetic Foot Exam - Simple   Simple Foot Form Diabetic Foot exam was performed with the following findings: Yes 07/02/2022  2:14 PM  Visual Inspection No deformities, no ulcerations, no other skin breakdown bilaterally: Yes Sensation Testing Intact to touch and monofilament testing bilaterally: Yes Pulse Check Posterior Tibialis and Dorsalis pulse intact bilaterally: Yes Comments    Foot exam UTD. Eye exam UTD.  Follow up in 3 months.   Iran Planas, PA-C

## 2022-07-04 ENCOUNTER — Other Ambulatory Visit: Payer: Self-pay | Admitting: Physician Assistant

## 2022-07-04 DIAGNOSIS — E119 Type 2 diabetes mellitus without complications: Secondary | ICD-10-CM

## 2022-07-08 ENCOUNTER — Telehealth: Payer: Self-pay

## 2022-07-08 NOTE — Telephone Encounter (Signed)
Initiated Prior authorization LFY:[id number] Libre 3 Sensor Via: Covermymeds Case/Key:[id number] Status: Pending as of 07/08/22 Reason: Notified Pt via: Mychart

## 2022-07-13 ENCOUNTER — Encounter: Payer: Self-pay | Admitting: Physician Assistant

## 2022-07-14 ENCOUNTER — Telehealth: Payer: Self-pay

## 2022-07-14 NOTE — Telephone Encounter (Addendum)
Initiated Prior authorization YIF:[id number] 2.5MG /0.5ML pen-injectors Via: Covermymeds Case/Key:[id number] Status: denied  as of 07/14/22 Reason:Based on the information provided, I am unable to approve coverage for this medication because: There is nothing to support that the individual has contraindication, or is intolerant to one of the  following covered alternatives: A. Byetta/Bydureon [may require prior authorization]; and B. Trulicity  [may require prior authorization]. There is no indication that your patient has met both of the following: A) Individual will continue  maximally tolerated metformin therapy, if not contraindicated, intolerant, or otherwise not a  candidate; and B) Documentation of one of the following: 1. Unable to achieve goal HbA1C despite  metformin or metformin-containing regimen (meglitinides, sulfonylureas, or thiazolidinediones) at  greater than or equal to 1,500 mg per day; 2. Intolerance to metformin 1,500 mg per day despite  appropriate dose titration duration (for example, period of 8-12 weeks); 3. Contraindication to  metformin per FDA label (for example, acute/chronic metabolic acidosis, severe renal dysfunction);  4. Not a candidate for metformin (for example, hepatic impairment, moderate renal dysfunction,  unstable heart failure, individual is using an agent for a non-diabetic FDA-approved indication); 5.  Initial metformin combination therapy is clinically appropriate for elevated HbA1C (for example,  greater than 1.5% above goal); or 6. Initial metformin combination therapy is clinically appropriate  in an individual with co-morbid conditions (such as ASCVD, heart failure, or CKD). This drug requires supportive documentation for all answers including chart notes, lab/test results.  This documentation was not included or was missing for some answers.  Notified Pt via: Mychart

## 2022-07-15 ENCOUNTER — Ambulatory Visit (INDEPENDENT_AMBULATORY_CARE_PROVIDER_SITE_OTHER): Payer: Commercial Managed Care - HMO

## 2022-07-15 DIAGNOSIS — Z1231 Encounter for screening mammogram for malignant neoplasm of breast: Secondary | ICD-10-CM

## 2022-07-16 LAB — CBC WITH DIFFERENTIAL/PLATELET
Absolute Monocytes: 326 cells/uL (ref 200–950)
Basophils Absolute: 51 cells/uL (ref 0–200)
Basophils Relative: 1 %
Eosinophils Absolute: 148 cells/uL (ref 15–500)
Eosinophils Relative: 2.9 %
HCT: 45.3 % — ABNORMAL HIGH (ref 35.0–45.0)
Hemoglobin: 14.3 g/dL (ref 11.7–15.5)
Lymphs Abs: 1576 cells/uL (ref 850–3900)
MCH: 23.8 pg — ABNORMAL LOW (ref 27.0–33.0)
MCHC: 31.6 g/dL — ABNORMAL LOW (ref 32.0–36.0)
MCV: 75.4 fL — ABNORMAL LOW (ref 80.0–100.0)
MPV: 10.7 fL (ref 7.5–12.5)
Monocytes Relative: 6.4 %
Neutro Abs: 2999 cells/uL (ref 1500–7800)
Neutrophils Relative %: 58.8 %
Platelets: 329 10*3/uL (ref 140–400)
RBC: 6.01 10*6/uL — ABNORMAL HIGH (ref 3.80–5.10)
RDW: 14.4 % (ref 11.0–15.0)
Total Lymphocyte: 30.9 %
WBC: 5.1 10*3/uL (ref 3.8–10.8)

## 2022-07-16 LAB — COMPLETE METABOLIC PANEL WITH GFR
AG Ratio: 1.4 (calc) (ref 1.0–2.5)
ALT: 11 U/L (ref 6–29)
AST: 15 U/L (ref 10–35)
Albumin: 4.9 g/dL (ref 3.6–5.1)
Alkaline phosphatase (APISO): 114 U/L (ref 37–153)
BUN: 14 mg/dL (ref 7–25)
CO2: 27 mmol/L (ref 20–32)
Calcium: 10.3 mg/dL (ref 8.6–10.4)
Chloride: 102 mmol/L (ref 98–110)
Creat: 0.97 mg/dL (ref 0.50–1.03)
Globulin: 3.6 g/dL (calc) (ref 1.9–3.7)
Glucose, Bld: 119 mg/dL — ABNORMAL HIGH (ref 65–99)
Potassium: 4.2 mmol/L (ref 3.5–5.3)
Sodium: 138 mmol/L (ref 135–146)
Total Bilirubin: 0.6 mg/dL (ref 0.2–1.2)
Total Protein: 8.5 g/dL — ABNORMAL HIGH (ref 6.1–8.1)
eGFR: 68 mL/min/{1.73_m2} (ref >=60)

## 2022-07-16 LAB — LIPID PANEL W/REFLEX DIRECT LDL
Cholesterol: 211 mg/dL — ABNORMAL HIGH (ref <200)
HDL: 76 mg/dL (ref >=50)
LDL Cholesterol (Calc): 120 mg/dL (calc) — ABNORMAL HIGH
Non-HDL Cholesterol (Calc): 135 mg/dL (calc) — ABNORMAL HIGH (ref <130)
Total CHOL/HDL Ratio: 2.8 (calc) (ref <5.0)
Triglycerides: 62 mg/dL (ref <150)

## 2022-07-16 LAB — TSH: TSH: 1.32 mIU/L (ref 0.40–4.50)

## 2022-07-16 LAB — HEPATITIS C ANTIBODY: Hepatitis C Ab: NONREACTIVE

## 2022-07-16 LAB — VITAMIN D 25 HYDROXY (VIT D DEFICIENCY, FRACTURES): Vit D, 25-Hydroxy: 73 ng/mL (ref 30–100)

## 2022-07-16 MED ORDER — ATORVASTATIN CALCIUM 40 MG PO TABS: 40.0000 mg | ORAL_TABLET | Freq: Every day | ORAL | 3 refills | Status: DC

## 2022-07-16 NOTE — Progress Notes (Signed)
Alexandra [name],   Thyroid looks good.  Vitamin D looks great.  LDL increasing and goal is under 70 we need to increase lipitor to '40mg'$ . Are you ok with that? I will send new rx?   RBC and hematocrit up but hemoglobin is good. I think if you just gave blood regularly you could keep these numbers to goal.

## 2022-07-16 NOTE — Addendum Note (Signed)
Addended by: Donella Stade on: 07/16/2022 09:26 AM   Modules accepted: Orders

## 2022-07-16 NOTE — Progress Notes (Signed)
Normal screening mammogram. Follow up in 1 year.

## 2022-08-21 ENCOUNTER — Telehealth: Payer: Commercial Managed Care - HMO | Admitting: Nurse Practitioner

## 2022-08-21 DIAGNOSIS — K0889 Other specified disorders of teeth and supporting structures: Secondary | ICD-10-CM | POA: Diagnosis not present

## 2022-08-21 MED ORDER — CLINDAMYCIN HCL 300 MG PO CAPS: 300.0000 mg | ORAL_CAPSULE | Freq: Four times a day (QID) | ORAL | 0 refills | Status: DC

## 2022-08-21 NOTE — Patient Instructions (Signed)
Alexandra [name], thank you for joining Chevis Pretty, FNP for today's virtual visit.  While this provider is not your primary care provider (PCP), if your PCP is located in our provider database this encounter information will be shared with them immediately following your visit.   [facility] account gives you access to today's visit and all your visits, tests, and labs performed at [facility] " click here if you don't have a [facility] account or go to [email or web address]  Consent: (Patient) Alexandra [name] provided verbal consent for this virtual visit at the beginning of the encounter.  Current Medications:  Current Outpatient Medications:    clindamycin (CLEOCIN) 300 MG capsule, Take 1 capsule (300 mg total) by mouth 4 (four) times daily., Disp: 40 capsule, Rfl: 0   AMBULATORY NON FORMULARY MEDICATION, Glucometer, test strips, and lancets, Disp: 100 each, Rfl: 0   atorvastatin (LIPITOR) 40 MG tablet, Take 1 tablet (40 mg total) by mouth daily., Disp: 90 tablet, Rfl: 3   blood glucose meter kit and supplies, Contour Next per pt request/ Use up to four times daily as directed. (FOR ICD-10 E10.9, E11.9)., Disp: 1 each, Rfl: 0   Blood Glucose Monitoring Suppl (ONE TOUCH ULTRA MINI) w/Device KIT, Dx DM E11.8. Check fasting blood sugar every morning and 2 hours after largest meal a few times weekly., Disp: 1 kit, Rfl: 0   Continuous Blood Gluc Sensor (FREESTYLE LIBRE 3 SENSOR) MISC, PLACE ONE SENSOR TO THE BACK OF YOUR UPPER ARM. REPLACE EVERY 14 DAYS., Disp: 2 each, Rfl: 1   Dapagliflozin-metFORMIN HCl ER (XIGDUO XR) 07-999 MG TB24, Take 1 tablet by mouth daily with breakfast., Disp: 90 tablet, Rfl: 0   glucose blood (ONETOUCH ULTRA) test strip, Dx DM E11.8. Check fasting blood sugar every morning and 2 hours after largest meal a few times weekly., Disp: 100 each, Rfl: 12   ipratropium (ATROVENT) 0.03 % nasal spray, Place 2 sprays into both nostrils  every 12 (twelve) hours., Disp: 30 mL, Rfl: 3   Lancets 30G MISC, Dx DM E11.8. Check fasting blood sugar every morning and 2 hours after largest meal a few times weekly., Disp: 100 each, Rfl: PRN   levocetirizine (XYZAL ALLERGY 24HR) 5 MG tablet, Take 1 tablet (5 mg total) by mouth every evening., Disp: 90 tablet, Rfl: 3   lisinopril (ZESTRIL) 2.5 MG tablet, Take 1 tablet (2.5 mg total) by mouth daily., Disp: 90 tablet, Rfl: 1   Semaglutide, 1 MG/DOSE, 4 MG/3ML SOPN, Inject 1 mg as directed once a week., Disp: 3 mL, Rfl: 0   tirzepatide (MOUNJARO) 2.5 MG/0.5ML Pen, Inject 2.5 mg into the skin once a week., Disp: 2 mL, Rfl: 0   Vitamin D, Ergocalciferol, (DRISDOL) 1.25 MG (50000 UNIT) CAPS capsule, Take 1 capsule (50,000 Units total) by mouth every 7 (seven) days., Disp: 12 capsule, Rfl: 3   Medications ordered in this encounter:  Meds ordered this encounter  Medications   clindamycin (CLEOCIN) 300 MG capsule    Sig: Take 1 capsule (300 mg total) by mouth 4 (four) times daily.    Dispense:  40 capsule    Refill:  0    Order Specific Question:   Supervising Provider    Answer:   Chase Picket [[id number]]     *If you need refills on other medications prior to your next appointment, please contact your pharmacy*  Follow-Up: Call back or seek an in-person evaluation if the symptoms worsen or if the  condition fails to improve as anticipated.  [place] [phone]  Other Instructions [place] bid See dentist as soon as possible   If you have been instructed to have an in-person evaluation today at a local Urgent Care facility, please use the link below. It will take you to a list of all of our available [facility], including address, phone number and hours of operation. Please do not delay care.  [facility]  If you or a family member do not have a primary care provider, use the link below to schedule a visit and establish care. When you choose a  [facility] primary care physician or advanced practice provider, you gain a long-term partner in health. Find a Primary Care Provider  Learn more about [facility] in-office and virtual care options: [facility]

## 2022-08-21 NOTE — Progress Notes (Signed)
Virtual Visit Consent   Alexandra [name], you are scheduled for a virtual visit with Mary-Margaret Hassell Done, [place], a [facility] provider, today.     Just as with appointments in the office, your consent must be obtained to participate.  Your consent will be active for this visit and any virtual visit you may have with one of our providers in the next 365 days.     If you have a MyChart account, a copy of this consent can be sent to you electronically.  All virtual visits are billed to your insurance company just like a traditional visit in the office.    As this is a virtual visit, video technology does not allow for your provider to perform a traditional examination.  This may limit your provider's ability to fully assess your condition.  If your provider identifies any concerns that need to be evaluated in person or the need to arrange testing (such as labs, EKG, etc.), we will make arrangements to do so.     Although advances in technology are sophisticated, we cannot ensure that it will always work on either your end or our end.  If the connection with a video visit is poor, the visit may have to be switched to a telephone visit.  With either a video or telephone visit, we are not always able to ensure that we have a secure connection.     I need to obtain your verbal consent now.   Are you willing to proceed with your visit today? YES   Alexandra [name] has provided verbal consent on 08/21/2022 for a virtual visit (video or telephone).   Mary-Margaret Hassell Done, FNP   Date: 08/21/2022 3:17 PM   Virtual Visit via Video Note   I, Mary-Margaret Hassell Done, connected with Alexandra [name] ([id number], 1966/07/16) on 08/21/22 at  3:45 PM EDT by a video-enabled telemedicine application and verified that I am speaking with the correct person using two identifiers.  Location: Patient: Virtual Visit Location Patient: Home Provider: Virtual Visit Location Provider: Mobile   I discussed the limitations of  evaluation and management by telemedicine and the availability of in person appointments. The patient expressed understanding and agreed to proceed.    History of Present Illness: Alexandra [name] is a 56 y.o. who identifies as a female who was assigned female at birth, and is being seen today for dental pain.  HPI: Patient says she scratch her upper  molar with some dental floss. Now the gum is irritated and hurts to close her mouth. The gum is puffy and swollen.  Dental Pain  This is a new problem. The current episode started in the past 7 days. The problem occurs constantly. The problem has been gradually worsening. The pain is at a severity of 7/10. The pain is moderate. Associated symptoms include thermal sensitivity. Pertinent negatives include no fever, oral bleeding or sinus pressure. She has tried NSAIDs for the symptoms.    Review of Systems  Constitutional:  Negative for fever.  HENT:  Negative for sinus pressure.        Dental  pain      Problems:  Patient Active Problem List   Diagnosis Date Noted   Controlled type 2 diabetes mellitus without complication, without long-term current use of insulin ([place]) 11/20/2021   Overweight (BMI 25.0-29.9) 09/24/2020   Elevated hematocrit 06/24/2020   Elevated red blood cell count 06/24/2020   Intertrigo 06/24/2020   Essential hypertension, benign 12/02/2015   Diabetes  type 2, uncontrolled 11/05/2015   Hyperlipidemia LDL goal <70 11/05/2015   Vitamin D deficiency 11/05/2015    Allergies:  Allergies  Allergen Reactions   Trulicity [Dulaglutide]     GI upset and made her more hungry.   Medications:  Current Outpatient Medications:    AMBULATORY NON FORMULARY MEDICATION, Glucometer, test strips, and lancets, Disp: 100 each, Rfl: 0   atorvastatin (LIPITOR) 40 MG tablet, Take 1 tablet (40 mg total) by mouth daily., Disp: 90 tablet, Rfl: 3   blood glucose meter kit and supplies, Contour Next per pt request/ Use up to four times daily  as directed. (FOR ICD-10 E10.9, E11.9)., Disp: 1 each, Rfl: 0   Blood Glucose Monitoring Suppl (ONE TOUCH ULTRA MINI) w/Device KIT, Dx DM E11.8. Check fasting blood sugar every morning and 2 hours after largest meal a few times weekly., Disp: 1 kit, Rfl: 0   Continuous Blood Gluc Sensor (FREESTYLE LIBRE 3 SENSOR) MISC, PLACE ONE SENSOR TO THE BACK OF YOUR UPPER ARM. REPLACE EVERY 14 DAYS., Disp: 2 each, Rfl: 1   Dapagliflozin-metFORMIN HCl ER (XIGDUO XR) 07-999 MG TB24, Take 1 tablet by mouth daily with breakfast., Disp: 90 tablet, Rfl: 0   glucose blood (ONETOUCH ULTRA) test strip, Dx DM E11.8. Check fasting blood sugar every morning and 2 hours after largest meal a few times weekly., Disp: 100 each, Rfl: 12   ipratropium (ATROVENT) 0.03 % nasal spray, Place 2 sprays into both nostrils every 12 (twelve) hours., Disp: 30 mL, Rfl: 3   Lancets 30G MISC, Dx DM E11.8. Check fasting blood sugar every morning and 2 hours after largest meal a few times weekly., Disp: 100 each, Rfl: PRN   levocetirizine (XYZAL ALLERGY 24HR) 5 MG tablet, Take 1 tablet (5 mg total) by mouth every evening., Disp: 90 tablet, Rfl: 3   lisinopril (ZESTRIL) 2.5 MG tablet, Take 1 tablet (2.5 mg total) by mouth daily., Disp: 90 tablet, Rfl: 1   Semaglutide, 1 MG/DOSE, 4 MG/3ML SOPN, Inject 1 mg as directed once a week., Disp: 3 mL, Rfl: 0   tirzepatide (MOUNJARO) 2.5 MG/0.5ML Pen, Inject 2.5 mg into the skin once a week., Disp: 2 mL, Rfl: 0   Vitamin D, Ergocalciferol, (DRISDOL) 1.25 MG (50000 UNIT) CAPS capsule, Take 1 capsule (50,000 Units total) by mouth every 7 (seven) days., Disp: 12 capsule, Rfl: 3  Observations/Objective: Patient is well-developed, well-nourished in no acute distress.  Resting comfortably  at home.  Head is normocephalic, atraumatic.  No labored breathing.  Speech is clear and coherent with logical content.  Patient is alert and oriented at baseline.  Right upper back molar sensitive to  touch  Assessment and Plan:  Alexandra [name] in today with chief complaint of Dental Pain   1. Pain, dental Ice See dentist as soon as possible  Meds ordered this encounter  Medications   clindamycin (CLEOCIN) 300 MG capsule    Sig: Take 1 capsule (300 mg total) by mouth 4 (four) times daily.    Dispense:  40 capsule    Refill:  0    Order Specific Question:   Supervising Provider    Answer:   Chase Picket [id number]      Follow Up Instructions: I discussed the assessment and treatment plan with the patient. The patient was provided an opportunity to ask questions and all were answered. The patient agreed with the plan and demonstrated an understanding of the instructions.  A copy of instructions were sent to  the patient via [place].  The patient was advised to call back or seek an in-person evaluation if the symptoms worsen or if the condition fails to improve as anticipated.  Time:  I spent 10 minutes with the patient via telehealth technology discussing the above problems/concerns.    Mary-Margaret Hassell Done, FNP

## 2022-09-12 ENCOUNTER — Other Ambulatory Visit: Payer: Self-pay | Admitting: Physician Assistant

## 2022-09-12 DIAGNOSIS — E119 Type 2 diabetes mellitus without complications: Secondary | ICD-10-CM

## 2022-10-19 ENCOUNTER — Ambulatory Visit: Payer: Commercial Managed Care - HMO | Admitting: Physician Assistant

## 2022-10-19 DIAGNOSIS — E119 Type 2 diabetes mellitus without complications: Secondary | ICD-10-CM

## 2022-11-22 ENCOUNTER — Encounter: Payer: Self-pay | Admitting: Physician Assistant

## 2022-11-30 ENCOUNTER — Ambulatory Visit: Payer: Commercial Managed Care - HMO | Admitting: Physician Assistant

## 2022-12-07 ENCOUNTER — Encounter: Payer: Self-pay | Admitting: Physician Assistant

## 2022-12-07 ENCOUNTER — Ambulatory Visit: Payer: Medicaid Other | Admitting: Physician Assistant

## 2022-12-07 VITALS — BP 130/85 | HR 98 | Ht 64.0 in | Wt 185.0 lb

## 2022-12-07 DIAGNOSIS — R4589 Other symptoms and signs involving emotional state: Secondary | ICD-10-CM | POA: Diagnosis not present

## 2022-12-07 DIAGNOSIS — I10 Essential (primary) hypertension: Secondary | ICD-10-CM

## 2022-12-07 DIAGNOSIS — Z6831 Body mass index (BMI) 31.0-31.9, adult: Secondary | ICD-10-CM | POA: Diagnosis not present

## 2022-12-07 DIAGNOSIS — E6609 Other obesity due to excess calories: Secondary | ICD-10-CM

## 2022-12-07 DIAGNOSIS — E1165 Type 2 diabetes mellitus with hyperglycemia: Secondary | ICD-10-CM

## 2022-12-07 DIAGNOSIS — E119 Type 2 diabetes mellitus without complications: Secondary | ICD-10-CM

## 2022-12-07 DIAGNOSIS — E785 Hyperlipidemia, unspecified: Secondary | ICD-10-CM | POA: Diagnosis not present

## 2022-12-07 LAB — POCT GLYCOSYLATED HEMOGLOBIN (HGB A1C): Hemoglobin A1C: 8.2 % — AB (ref 4.0–5.6)

## 2022-12-07 MED ORDER — SYNJARDY 12.5-1000 MG PO TABS: 1.0000 | ORAL_TABLET | Freq: Two times a day (BID) | ORAL | 2 refills | Status: DC

## 2022-12-07 MED ORDER — LISINOPRIL 2.5 MG PO TABS: 2.5000 mg | ORAL_TABLET | Freq: Every day | ORAL | 1 refills | Status: DC

## 2022-12-07 MED ORDER — SEMAGLUTIDE (1 MG/DOSE) 4 MG/3ML ~~LOC~~ SOPN: 1.0000 mg | PEN_INJECTOR | SUBCUTANEOUS | 2 refills | Status: DC

## 2022-12-07 MED ORDER — OZEMPIC (0.25 OR 0.5 MG/DOSE) 2 MG/3ML ~~LOC~~ SOPN: 0.5000 mg | PEN_INJECTOR | SUBCUTANEOUS | 0 refills | Status: DC

## 2022-12-07 NOTE — Patient Instructions (Signed)
Start synjardy and ozempic back up

## 2022-12-07 NOTE — Progress Notes (Signed)
Established Patient Office Visit  Subjective   Patient ID: Alexandra [name], female    DOB: 10-02-1966  Age: 57 y.o. MRN: [id number]  Chief Complaint  Patient presents with   Follow-up    HPI Pt is a 57 yo obese female with T2DM, HTN, HLD who presents to the clinic for follow up and medication refills.   She has not been on medication for DM in at least 3 months. She is not checking her sugars. She has not been able to afford ozempic and synjardy. Her last [name] was 6.7. no hypoglycemic events. No open sores or wounds. No CP, palpitations, or headaches. Trulicity caused GI upset and headaches.  She has gained weight and wants to restart her medications. She now has medicaid.    Active Ambulatory Problems    Diagnosis Date Noted   Diabetes type 2, uncontrolled 11/05/2015   Hyperlipidemia LDL goal <70 11/05/2015   Vitamin D deficiency 11/05/2015   Essential hypertension, benign 12/02/2015   Elevated hematocrit 06/24/2020   Elevated red blood cell count 06/24/2020   Intertrigo 06/24/2020   Overweight (BMI 25.0-29.9) 09/24/2020   Controlled type 2 diabetes mellitus without complication, without long-term current use of insulin ([place]) 11/20/2021   Depressed mood 12/07/2022   Resolved Ambulatory Problems    Diagnosis Date Noted   Elevated liver enzymes 11/05/2015   Obesity 12/02/2015   Past Medical History:  Diagnosis Date   Allergy    Diabetes mellitus without complication ([place])    Hyperlipidemia    Hypertension      ROS See HPI.    Objective:     BP 130/85 (BP Location: Right Arm, Patient Position: Sitting, Cuff Size: Large)   Pulse 98   Ht 5' 4"$  (1.626 m)   Wt 185 lb (83.9 kg)   LMP 09/19/2015 Comment: none since dec 2016  SpO2 99%   BMI 31.76 kg/m  BP Readings from Last 3 Encounters:  12/07/22 130/85  07/02/22 139/85  02/24/22 106/78   Wt Readings from Last 3 Encounters:  12/07/22 185 lb (83.9 kg)  07/02/22 166 lb (75.3 kg)  02/24/22 169 lb (76.7 kg)     ..    12/07/2022    1:33 PM 11/20/2021    1:28 PM 07/01/2021   10:38 AM 03/25/2021   10:42 AM 03/11/2020    1:47 PM  Depression screen PHQ 2/9  Decreased Interest 2 1 0 0 0  Down, Depressed, Hopeless 2 0 0 0 0  PHQ - 2 Score 4 1 0 0 0  Altered sleeping 2    2  Tired, decreased energy 2    2  Change in appetite 3    2  Feeling bad or failure about yourself  3    0  Trouble concentrating 0    0  Moving slowly or fidgety/restless 0    0  Suicidal thoughts 0    0  PHQ-9 Score 14    6  Difficult doing work/chores Not difficult at all    Not difficult at all   ..    12/07/2022    1:34 PM 03/11/2020    1:47 PM  GAD 7 : Generalized Anxiety Score  Nervous, Anxious, on Edge 1 0  Control/stop worrying 1 0  Worry too much - different things 1 0  Trouble relaxing 1 0  Restless 0 0  Easily annoyed or irritable 1 0  Afraid - awful might happen 1 0  Total GAD 7 Score 6  0  Anxiety Difficulty Not difficult at all Not difficult at all     Lab Results  Component Value Date   HGBA1C 8.2 (A) 12/07/2022    Physical Exam Constitutional:      Appearance: Normal appearance. She is obese.  HENT:     Head: Normocephalic.  Neck:     Vascular: No carotid bruit.  Cardiovascular:     Rate and Rhythm: Normal rate and regular rhythm.  Pulmonary:     Effort: Pulmonary effort is normal.  Musculoskeletal:     Cervical back: Normal range of motion and neck supple. No rigidity or tenderness.     Right lower leg: No edema.     Left lower leg: No edema.  Lymphadenopathy:     Cervical: No cervical adenopathy.  Neurological:     General: No focal deficit present.     Mental Status: She is alert and oriented to person, place, and time.  Psychiatric:        Mood and Affect: Mood normal.       The 10-year ASCVD risk score (Arnett DK, et al., 2019) is: 11.1%    Assessment & Plan:  Marland KitchenMarland KitchenSam was seen today for follow-up.  Diagnoses and all orders for this visit:  Uncontrolled type 2 diabetes  mellitus with hyperglycemia ([place]) -     POCT glycosylated hemoglobin (Hb [name]) -     Empagliflozin-metFORMIN HCl (SYNJARDY) 12.02-999 MG TABS; Take 1 tablet by mouth 2 (two) times daily. -     Semaglutide,0.25 or 0.5MG/DOS, (OZEMPIC, 0.25 OR 0.5 MG/DOSE,) 2 MG/3ML SOPN; Inject 0.5 mg into the skin once a week. -     Semaglutide, 1 MG/DOSE, 4 MG/3ML SOPN; Inject 1 mg as directed once a week.  Hyperlipidemia LDL goal <70  Essential hypertension, benign  Depressed mood   PHQ/GAD numbers were up She declined medication today stating "this is just temporary" If more problems or symptoms worsening instructed to follow up Encouraged regular exercise, self care, mindfullness  [name] not to goal but she has not been on her medication due to insurance cost She is now on medicaid and needs new scripts Discussed DM diet Start ozempic and given titration to 78m  Start synjardy daily On statin BP to goal, on lisinopril refill sent Foot and eye exam UTD Declined covid and flu shot Follow up in 3 months    Return in about 3 months (around 03/07/2023).    JIran Planas PA-C

## 2022-12-22 ENCOUNTER — Telehealth: Payer: Self-pay

## 2022-12-22 NOTE — Telephone Encounter (Signed)
Alexandra [name] needs a prior authorization for [employer].

## 2022-12-23 ENCOUNTER — Encounter: Payer: Self-pay | Admitting: Physician Assistant

## 2022-12-23 ENCOUNTER — Telehealth: Payer: Self-pay

## 2022-12-23 NOTE — Telephone Encounter (Addendum)
Initiated Prior authorization BSJ:[id number] 12.5-1000MG  tablets Via: Covermymeds Case/Key:[id number] Status: denied as of 12/23/22 Reason:criteria not met Notified Pt via: Mychart    Initiated Prior authorization UTM:[id number] (0.25 or 0.5 MG/DOSE) 2MG /3ML pen-injectors Via: Covermymeds Case/Key:[id number] Status: approved  as of 12/23/22 Reason:12/23/23 Notified Pt via: Mychart

## 2023-01-20 ENCOUNTER — Encounter: Payer: Self-pay | Admitting: Physician Assistant

## 2023-01-20 DIAGNOSIS — E1165 Type 2 diabetes mellitus with hyperglycemia: Secondary | ICD-10-CM

## 2023-01-20 MED ORDER — SYNJARDY XR 12.5-1000 MG PO TB24: 1.0000 | ORAL_TABLET | Freq: Every day | ORAL | 1 refills | Status: DC

## 2023-01-27 ENCOUNTER — Encounter: Payer: Self-pay | Admitting: Physician Assistant

## 2023-01-27 ENCOUNTER — Other Ambulatory Visit: Payer: Self-pay | Admitting: Physician Assistant

## 2023-01-27 DIAGNOSIS — E1165 Type 2 diabetes mellitus with hyperglycemia: Secondary | ICD-10-CM

## 2023-01-28 NOTE — Telephone Encounter (Signed)
For PA new insurance.

## 2023-01-29 ENCOUNTER — Telehealth: Payer: Self-pay

## 2023-01-29 NOTE — Telephone Encounter (Signed)
Initiated Prior authorization ZLD:[id number] XR 12.5-1000MG  er tablets Via: Covermymeds Case/Key:[id number] Status: Pending as of 01/29/23 Reason: Notified Pt via: Mychart   Initiated Prior authorization TJQ:[id number], 1 MG/DOSE, 4 MG/3ML Via: Covermymeds Case/Key:[id number] Status: Pending as of 01/29/23 Reason: Notified Pt via: Mychart

## 2023-03-07 ENCOUNTER — Ambulatory Visit: Payer: Medicaid Other | Admitting: Physician Assistant

## 2023-03-08 ENCOUNTER — Ambulatory Visit: Payer: Medicaid Other | Admitting: Physician Assistant

## 2023-03-08 VITALS — BP 131/78 | HR 74 | Ht 64.0 in | Wt 181.0 lb

## 2023-03-08 DIAGNOSIS — E785 Hyperlipidemia, unspecified: Secondary | ICD-10-CM | POA: Diagnosis not present

## 2023-03-08 DIAGNOSIS — E1165 Type 2 diabetes mellitus with hyperglycemia: Secondary | ICD-10-CM | POA: Diagnosis not present

## 2023-03-08 DIAGNOSIS — Z794 Long term (current) use of insulin: Secondary | ICD-10-CM | POA: Diagnosis not present

## 2023-03-08 DIAGNOSIS — E66811 Obesity, class 1: Secondary | ICD-10-CM

## 2023-03-08 DIAGNOSIS — E6609 Other obesity due to excess calories: Secondary | ICD-10-CM

## 2023-03-08 DIAGNOSIS — I10 Essential (primary) hypertension: Secondary | ICD-10-CM

## 2023-03-08 LAB — POCT GLYCOSYLATED HEMOGLOBIN (HGB A1C): Hemoglobin A1C: 7.8 % — AB (ref 4.0–5.6)

## 2023-03-08 MED ORDER — SEMAGLUTIDE (2 MG/DOSE) 8 MG/3ML ~~LOC~~ SOPN
2.0000 mg | PEN_INJECTOR | SUBCUTANEOUS | 0 refills | Status: DC
Start: 2023-03-08 — End: 2023-06-21

## 2023-03-08 MED ORDER — SEMAGLUTIDE (2 MG/DOSE) 8 MG/3ML ~~LOC~~ SOPN: 2.0000 mg | PEN_INJECTOR | SUBCUTANEOUS | 0 refills | Status: DC

## 2023-03-08 MED ORDER — SYNJARDY 12.5-1000 MG PO TABS
1.0000 | ORAL_TABLET | Freq: Two times a day (BID) | ORAL | 2 refills | Status: DC
Start: 2023-03-08 — End: 2023-06-22

## 2023-03-08 MED ORDER — SYNJARDY 12.5-1000 MG PO TABS: 1.0000 | ORAL_TABLET | Freq: Two times a day (BID) | ORAL | 2 refills | Status: DC

## 2023-03-08 NOTE — Patient Instructions (Signed)
Ashwaganda or lavendar tablets

## 2023-03-08 NOTE — Progress Notes (Unsigned)
   Established Patient Office Visit  Subjective   Patient ID: BRADLEIGH HEDIN, female    DOB: April 24, 1966  Age: 57 y.o. MRN: [id number]  No chief complaint on file.   HPI  .Marland Kitchen Active Ambulatory Problems    Diagnosis Date Noted   Diabetes type 2, uncontrolled 11/05/2015   Hyperlipidemia LDL goal <70 11/05/2015   Vitamin D deficiency 11/05/2015   Essential hypertension, benign 12/02/2015   Class 1 obesity due to excess calories with serious comorbidity and body mass index (BMI) of 31.0 to 31.9 in adult 12/02/2015   Elevated hematocrit 06/24/2020   Elevated red blood cell count 06/24/2020   Intertrigo 06/24/2020   Overweight (BMI 25.0-29.9) 09/24/2020   Controlled type 2 diabetes mellitus without complication, without long-term current use of insulin ([facility]) 11/20/2021   Depressed mood 12/07/2022   Resolved Ambulatory Problems    Diagnosis Date Noted   Elevated liver enzymes 11/05/2015   Past Medical History:  Diagnosis Date   Allergy    Diabetes mellitus without complication (HCC)    Hyperlipidemia    Hypertension     Review of Systems  All other systems reviewed and are negative.     Objective:     LMP 09/19/2015 Comment: none since dec 2016 {Vitals History (Optional):23777}  Physical Exam   No results found for any visits on 03/08/23.  {Labs (Optional):23779}  The 10-year ASCVD risk score (Arnett DK, et al., 2019) is: 12.1%    Assessment & Plan:   Problem List Items Addressed This Visit   None   No follow-ups on file.    Tandy Gaw, PA-C

## 2023-03-09 ENCOUNTER — Encounter: Payer: Self-pay | Admitting: Physician Assistant

## 2023-04-09 ENCOUNTER — Other Ambulatory Visit: Payer: Self-pay | Admitting: Physician Assistant

## 2023-04-09 DIAGNOSIS — E1165 Type 2 diabetes mellitus with hyperglycemia: Secondary | ICD-10-CM

## 2023-06-08 ENCOUNTER — Ambulatory Visit: Payer: Medicaid Other | Admitting: Physician Assistant

## 2023-06-08 ENCOUNTER — Other Ambulatory Visit: Payer: Self-pay | Admitting: Physician Assistant

## 2023-06-08 DIAGNOSIS — I10 Essential (primary) hypertension: Secondary | ICD-10-CM

## 2023-06-13 LAB — HM DIABETES EYE EXAM

## 2023-06-15 ENCOUNTER — Other Ambulatory Visit: Payer: Self-pay | Admitting: Physician Assistant

## 2023-06-15 DIAGNOSIS — E1165 Type 2 diabetes mellitus with hyperglycemia: Secondary | ICD-10-CM

## 2023-06-21 ENCOUNTER — Other Ambulatory Visit: Payer: Self-pay | Admitting: Physician Assistant

## 2023-06-21 DIAGNOSIS — E1165 Type 2 diabetes mellitus with hyperglycemia: Secondary | ICD-10-CM

## 2023-06-22 MED ORDER — SEMAGLUTIDE (2 MG/DOSE) 8 MG/3ML ~~LOC~~ SOPN
2.0000 mg | PEN_INJECTOR | SUBCUTANEOUS | 0 refills | Status: DC
Start: 2023-06-22 — End: 2023-07-12

## 2023-06-22 NOTE — Telephone Encounter (Signed)
Requesting rx rf of semaglutide 2mg    Last written 03/08/2023 Last OV 03/08/2023 Upcoming appt 06/28/2023

## 2023-06-28 ENCOUNTER — Ambulatory Visit: Payer: Medicaid Other | Admitting: Physician Assistant

## 2023-06-29 ENCOUNTER — Ambulatory Visit: Payer: Medicaid Other | Admitting: Physician Assistant

## 2023-07-07 ENCOUNTER — Encounter: Payer: Self-pay | Admitting: Physician Assistant

## 2023-07-08 ENCOUNTER — Telehealth: Payer: Self-pay | Admitting: Family Medicine

## 2023-07-08 NOTE — Telephone Encounter (Signed)
Please call patient: Due for diabetes follow-up, urine microalbumin, foot exam, Pap smear etc.

## 2023-07-12 ENCOUNTER — Encounter: Payer: Self-pay | Admitting: Physician Assistant

## 2023-07-12 ENCOUNTER — Ambulatory Visit: Payer: Medicaid Other | Admitting: Physician Assistant

## 2023-07-12 VITALS — BP 101/70 | HR 71 | Ht 64.0 in | Wt 168.0 lb

## 2023-07-12 DIAGNOSIS — E785 Hyperlipidemia, unspecified: Secondary | ICD-10-CM | POA: Diagnosis not present

## 2023-07-12 DIAGNOSIS — Z7985 Long-term (current) use of injectable non-insulin antidiabetic drugs: Secondary | ICD-10-CM | POA: Diagnosis not present

## 2023-07-12 DIAGNOSIS — I10 Essential (primary) hypertension: Secondary | ICD-10-CM

## 2023-07-12 DIAGNOSIS — Z1329 Encounter for screening for other suspected endocrine disorder: Secondary | ICD-10-CM | POA: Diagnosis not present

## 2023-07-12 DIAGNOSIS — E1165 Type 2 diabetes mellitus with hyperglycemia: Secondary | ICD-10-CM | POA: Diagnosis not present

## 2023-07-12 DIAGNOSIS — E079 Disorder of thyroid, unspecified: Secondary | ICD-10-CM

## 2023-07-12 LAB — POCT GLYCOSYLATED HEMOGLOBIN (HGB A1C): Hemoglobin A1C: 7.6 % — AB (ref 4.0–5.6)

## 2023-07-12 MED ORDER — ATORVASTATIN CALCIUM 40 MG PO TABS
40.0000 mg | ORAL_TABLET | Freq: Every day | ORAL | 3 refills | Status: DC
Start: 2023-07-12 — End: 2024-05-28

## 2023-07-12 MED ORDER — SEMAGLUTIDE (1 MG/DOSE) 4 MG/3ML ~~LOC~~ SOPN
1.0000 mg | PEN_INJECTOR | SUBCUTANEOUS | 0 refills | Status: DC
Start: 2023-07-12 — End: 2023-07-27

## 2023-07-12 NOTE — Progress Notes (Unsigned)
Established Patient Office Visit  Subjective   Patient ID: Alexandra [name], female    DOB: 1966/05/12  Age: 57 y.o. MRN: [id number]  Chief Complaint  Patient presents with   Medical Management of Chronic Issues    Last [name] 7.8    HPI Pt is a 57 yo female with T2DM, HTN, HLD who presents to the clinic for 3 month follow up.   She is not checking her sugars. She has not liked the 2mg  ozempic. It makes her feel bloated all the time. She would like to decrease back to 1mg . She has had a terrible diet for past 3 months. She is not exercising. She denies any hypoglycemic events. Denies any CP, palpitations, headaches or vision changes.   .. Active Ambulatory Problems    Diagnosis Date Noted   Diabetes type 2, uncontrolled 11/05/2015   Hyperlipidemia LDL goal <70 11/05/2015   Vitamin D deficiency 11/05/2015   Essential hypertension, benign 12/02/2015   Class 1 obesity due to excess calories with serious comorbidity and body mass index (BMI) of 31.0 to 31.9 in adult 12/02/2015   Elevated hematocrit 06/24/2020   Elevated red blood cell count 06/24/2020   Intertrigo 06/24/2020   Overweight (BMI 25.0-29.9) 09/24/2020   Controlled type 2 diabetes mellitus without complication, without long-term current use of insulin ([facility]) 11/20/2021   Depressed mood 12/07/2022   Resolved Ambulatory Problems    Diagnosis Date Noted   Elevated liver enzymes 11/05/2015   Past Medical History:  Diagnosis Date   Allergy    Diabetes mellitus without complication (HCC)    Hyperlipidemia    Hypertension        ROS See HPI.    Objective:     BP 101/70   Pulse 71   Ht 5\' 4"  (1.626 m)   Wt 168 lb (76.2 kg)   LMP 09/19/2015 Comment: none since dec 2016  BMI 28.84 kg/m  BP Readings from Last 3 Encounters:  07/12/23 101/70  03/08/23 131/78  12/07/22 130/85   Wt Readings from Last 3 Encounters:  07/12/23 168 lb (76.2 kg)  03/08/23 181 lb (82.1 kg)  12/07/22 185 lb (83.9 kg)   ..     07/14/2023    9:45 AM 12/07/2022    1:33 PM 11/20/2021    1:28 PM 07/01/2021   10:38 AM 03/25/2021   10:42 AM  Depression screen PHQ 2/9  Decreased Interest 0 2 1 0 0  Down, Depressed, Hopeless 0 2 0 0 0  PHQ - 2 Score 0 4 1 0 0  Altered sleeping 0 2     Tired, decreased energy 1 2     Change in appetite 1 3     Feeling bad or failure about yourself  1 3     Trouble concentrating 0 0     Moving slowly or fidgety/restless 0 0     Suicidal thoughts 0 0     PHQ-9 Score 3 14     Difficult doing work/chores Not difficult at all Not difficult at all      . Results for orders placed or performed in visit on 07/12/23  POCT HgB [name]  Result Value Ref Range   Hemoglobin [name] 7.6 (A) 4.0 - 5.6 %   HbA1c POC (<> result, manual entry)     HbA1c, POC (prediabetic range)     HbA1c, POC (controlled diabetic range)       Physical Exam Constitutional:      Appearance: Normal  appearance.  HENT:     Head: Normocephalic.  Cardiovascular:     Rate and Rhythm: Normal rate and regular rhythm.  Pulmonary:     Effort: Pulmonary effort is normal.     Breath sounds: Normal breath sounds.  Musculoskeletal:     Cervical back: Normal range of motion and neck supple. No tenderness.     Right lower leg: No edema.     Left lower leg: No edema.  Lymphadenopathy:     Cervical: No cervical adenopathy.  Neurological:     General: No focal deficit present.     Mental Status: She is alert and oriented to person, place, and time.  Psychiatric:        Mood and Affect: Mood normal.       The 10-year ASCVD risk score (Arnett DK, et al., 2019) is: 5.6%    Assessment & Plan:  Marland KitchenMarland KitchenVelvilee "Evette" was seen today for medical management of chronic issues.  Diagnoses and all orders for this visit:  Uncontrolled type 2 diabetes mellitus with hyperglycemia (HCC) -     POCT Urine Albumin/Creatinine with ratio [[id number]] -     CMP14+EGFR -     POCT HgB [name] -     Lipid panel -     TSH -     Semaglutide, 1  MG/DOSE, 4 MG/3ML SOPN; Inject 1 mg as directed once a week.  Hyperlipidemia LDL goal <70 -     atorvastatin (LIPITOR) 40 MG tablet; Take 1 tablet (40 mg total) by mouth daily. -     Lipid panel  Essential hypertension, benign -     CMP14+EGFR  Thyroid disorder screening -     TSH   [name] not to goal Pt agrees to tighten up her diet Decrease ozempic due to side effects at 2mg  weekly dosing Continue synjardy BP to goal On statin Eye and foot exam UTD Could not get sample for urine microalbumin today Declined flu and covid vaccines Fasting labs ordered    Return in about 3 months (around 10/11/2023).    Tandy Gaw, PA-C

## 2023-07-14 ENCOUNTER — Encounter: Payer: Self-pay | Admitting: Physician Assistant

## 2023-07-26 ENCOUNTER — Other Ambulatory Visit: Payer: Self-pay | Admitting: Physician Assistant

## 2023-07-26 DIAGNOSIS — E1165 Type 2 diabetes mellitus with hyperglycemia: Secondary | ICD-10-CM

## 2023-08-05 ENCOUNTER — Encounter: Payer: Self-pay | Admitting: Physician Assistant

## 2023-08-05 MED ORDER — LORATADINE 10 MG PO TABS
10.0000 mg | ORAL_TABLET | Freq: Every day | ORAL | 3 refills | Status: DC
Start: 2023-08-05 — End: 2024-01-20

## 2023-09-06 DIAGNOSIS — H25043 Posterior subcapsular polar age-related cataract, bilateral: Secondary | ICD-10-CM | POA: Diagnosis not present

## 2023-09-06 DIAGNOSIS — H18413 Arcus senilis, bilateral: Secondary | ICD-10-CM | POA: Diagnosis not present

## 2023-09-06 DIAGNOSIS — H2511 Age-related nuclear cataract, right eye: Secondary | ICD-10-CM | POA: Diagnosis not present

## 2023-09-06 DIAGNOSIS — H25013 Cortical age-related cataract, bilateral: Secondary | ICD-10-CM | POA: Diagnosis not present

## 2023-09-06 DIAGNOSIS — H2513 Age-related nuclear cataract, bilateral: Secondary | ICD-10-CM | POA: Diagnosis not present

## 2023-09-24 ENCOUNTER — Other Ambulatory Visit: Payer: Self-pay | Admitting: Physician Assistant

## 2023-09-24 DIAGNOSIS — E1165 Type 2 diabetes mellitus with hyperglycemia: Secondary | ICD-10-CM

## 2023-10-10 ENCOUNTER — Ambulatory Visit: Payer: Medicaid Other | Admitting: Physician Assistant

## 2023-10-21 ENCOUNTER — Encounter: Payer: Self-pay | Admitting: Physician Assistant

## 2023-10-21 ENCOUNTER — Ambulatory Visit: Payer: Medicaid Other | Admitting: Physician Assistant

## 2023-10-21 VITALS — BP 94/78 | HR 96 | Ht 64.0 in | Wt 174.0 lb

## 2023-10-21 DIAGNOSIS — E785 Hyperlipidemia, unspecified: Secondary | ICD-10-CM | POA: Diagnosis not present

## 2023-10-21 DIAGNOSIS — Z7985 Long-term (current) use of injectable non-insulin antidiabetic drugs: Secondary | ICD-10-CM

## 2023-10-21 DIAGNOSIS — I10 Essential (primary) hypertension: Secondary | ICD-10-CM | POA: Diagnosis not present

## 2023-10-21 DIAGNOSIS — E1165 Type 2 diabetes mellitus with hyperglycemia: Secondary | ICD-10-CM | POA: Diagnosis not present

## 2023-10-21 DIAGNOSIS — E663 Overweight: Secondary | ICD-10-CM | POA: Diagnosis not present

## 2023-10-21 DIAGNOSIS — E1169 Type 2 diabetes mellitus with other specified complication: Secondary | ICD-10-CM | POA: Diagnosis not present

## 2023-10-21 LAB — POCT UA - MICROALBUMIN
Albumin/Creatinine Ratio, Urine, POC: <30
Creatinine, POC: 300 mg/dL
Microalbumin Ur, POC: 10 mg/L

## 2023-10-21 LAB — POCT GLYCOSYLATED HEMOGLOBIN (HGB A1C): Hemoglobin A1C: 7.3 % — AB (ref 4.0–5.6)

## 2023-10-21 MED ORDER — SYNJARDY 12.5-1000 MG PO TABS: 1.0000 | ORAL_TABLET | Freq: Two times a day (BID) | ORAL | 2 refills | Status: DC

## 2023-10-21 MED ORDER — OZEMPIC (1 MG/DOSE) 4 MG/3ML ~~LOC~~ SOPN: PEN_INJECTOR | SUBCUTANEOUS | 2 refills | Status: DC

## 2023-10-21 NOTE — Patient Instructions (Signed)

## 2023-10-21 NOTE — Progress Notes (Signed)
 Established Patient Office Visit  Subjective   Patient ID: Alexandra [name], female    DOB: 1966/01/19  Age: 58 y.o. MRN: [id number]  Chief Complaint  Patient presents with   Medical Management of Chronic Issues    Last [name] 7.6    HPI Pt is a 58 yo female with T2DM, HLD,HTN who presents to the clinic for follow up.   She is not checking her sugars. She is very active. She is compliant with medications. She denies any CP, palpitations, headaches or vision changes. Tolerating ozempic  well at 1mg  but does not want to go up any further. Last [name] is 7.6.   Alexandra [name]. Active Ambulatory Problems    Diagnosis Date Noted   Diabetes type 2, uncontrolled 11/05/2015   Hyperlipidemia LDL goal <70 11/05/2015   Vitamin D  deficiency 11/05/2015   Essential hypertension, benign 12/02/2015   Class 1 obesity due to excess calories with serious comorbidity and body mass index (BMI) of 31.0 to 31.9 in adult 12/02/2015   Elevated hematocrit 06/24/2020   Elevated red blood cell count 06/24/2020   Intertrigo 06/24/2020   Overweight (BMI 25.0-29.9) 09/24/2020   Controlled type 2 diabetes mellitus without complication, without long-term current use of insulin  ([facility]) 11/20/2021   Depressed mood 12/07/2022   Resolved Ambulatory Problems    Diagnosis Date Noted   Elevated liver enzymes 11/05/2015   Past Medical History:  Diagnosis Date   Allergy    Diabetes mellitus without complication (HCC)    Hyperlipidemia    Hypertension      ROS See HPI.    Objective:     BP 94/78   Pulse 96   Ht 5' 4 (1.626 m)   Wt 174 lb (78.9 kg)   LMP 09/19/2015 Comment: none since dec 2016  SpO2 99%   BMI 29.87 kg/m  BP Readings from Last 3 Encounters:  10/21/23 94/78  07/12/23 101/70  03/08/23 131/78   Wt Readings from Last 3 Encounters:  10/21/23 174 lb (78.9 kg)  07/12/23 168 lb (76.2 kg)  03/08/23 181 lb (82.1 kg)      Physical Exam Constitutional:      Appearance: Normal appearance.  HENT:      Head: Normocephalic.  Cardiovascular:     Rate and Rhythm: Normal rate and regular rhythm.  Pulmonary:     Effort: Pulmonary effort is normal.     Breath sounds: Normal breath sounds.  Neurological:     General: No focal deficit present.     Mental Status: She is alert and oriented to person, place, and time.  Psychiatric:        Mood and Affect: Mood normal.      Results for orders placed or performed in visit on 10/21/23  POCT HgB [name]  Result Value Ref Range   Hemoglobin [name] 7.3 (A) 4.0 - 5.6 %   HbA1c POC (<> result, manual entry)     HbA1c, POC (prediabetic range)     HbA1c, POC (controlled diabetic range)    POCT UA - Microalbumin  Result Value Ref Range   Microalbumin Ur, POC 10 mg/L   Creatinine, POC 300 mg/dL   Albumin/Creatinine Ratio, Urine, POC <30       The 10-year ASCVD risk score (Arnett DK, et al., 2019) is: 4.5%    Assessment & Plan:  [name] was seen today for medical management of chronic issues.  Diagnoses and all orders for this visit:  Controlled type 2 diabetes mellitus with other  specified complication, without long-term current use of insulin  ([facility]) -     POCT HgB [name] -     POCT UA - Microalbumin -     Discontinue: Semaglutide , 1 MG/DOSE, (OZEMPIC , 1 MG/DOSE,) 4 MG/3ML SOPN; INJECT 1MG  UNDER THE SKIN EVERY WEEK AS DIRECTED -     Discontinue: Empagliflozin-metFORMIN  HCl (SYNJARDY ) 12.02-999 MG TABS; Take 1 tablet by mouth 2 (two) times daily. -     CMP14+EGFR -     TSH + free T4 -     Empagliflozin-metFORMIN  HCl (SYNJARDY ) 12.02-999 MG TABS; Take 1 tablet by mouth 2 (two) times daily. -     Semaglutide , 1 MG/DOSE, (OZEMPIC , 1 MG/DOSE,) 4 MG/3ML SOPN; INJECT 1MG  UNDER THE SKIN EVERY WEEK AS DIRECTED  Hyperlipidemia LDL goal <70 -     Lipid panel -     CMP14+EGFR  Essential hypertension, benign -     CMP14+EGFR  Overweight (BMI 25.0-29.9)   [name] improving and very close to goal Continue on same dosages of ozempic  and  synjardy  Work on diet and exercise Cmp ordered for management Lipid ordered Microalbumin normal BP to goal Declined covid and flu vaccine   Return in about 3 months (around 01/19/2024).    [name], PA-C

## 2023-10-22 LAB — CMP14+EGFR
ALT: 20 [IU]/L (ref 0–32)
AST: 22 [IU]/L (ref 0–40)
Albumin: 4.4 g/dL (ref 3.8–4.9)
Alkaline Phosphatase: 139 [IU]/L — ABNORMAL HIGH (ref 44–121)
BUN/Creatinine Ratio: 19 (ref 9–23)
BUN: 18 mg/dL (ref 6–24)
Bilirubin Total: 0.5 mg/dL (ref 0.0–1.2)
CO2: 24 mmol/L (ref 20–29)
Calcium: 9.8 mg/dL (ref 8.7–10.2)
Chloride: 101 mmol/L (ref 96–106)
Creatinine, Ser: 0.97 mg/dL (ref 0.57–1.00)
Globulin, Total: 3.5 g/dL (ref 1.5–4.5)
Glucose: 126 mg/dL — ABNORMAL HIGH (ref 70–99)
Potassium: 4.7 mmol/L (ref 3.5–5.2)
Sodium: 140 mmol/L (ref 134–144)
Total Protein: 7.9 g/dL (ref 6.0–8.5)
eGFR: 68 mL/min/{1.73_m2} (ref >59)

## 2023-10-22 LAB — TSH+FREE T4
Free T4: 1.16 ng/dL (ref 0.82–1.77)
TSH: 1.65 u[IU]/mL (ref 0.450–4.500)

## 2023-10-22 LAB — LIPID PANEL
Chol/HDL Ratio: 2.8 {ratio} (ref 0.0–4.4)
Cholesterol, Total: 210 mg/dL — ABNORMAL HIGH (ref 100–199)
HDL: 75 mg/dL (ref >39)
LDL Chol Calc (NIH): 122 mg/dL — ABNORMAL HIGH (ref 0–99)
Triglycerides: 71 mg/dL (ref 0–149)
VLDL Cholesterol Cal: 13 mg/dL (ref 5–40)

## 2023-10-24 NOTE — Progress Notes (Signed)
 Alexandra [name],   Your HDL, good cholesterol, looks great. Your LDL is not to goal.  We need to increase lipitor to 80mg  daily. Are you ok with this?  LDL goal is under 70 for you.   Thyroid looks good.  Kidney function looks good.

## 2023-10-25 ENCOUNTER — Encounter: Payer: Self-pay | Admitting: Physician Assistant

## 2023-10-25 DIAGNOSIS — E1165 Type 2 diabetes mellitus with hyperglycemia: Secondary | ICD-10-CM

## 2023-10-26 ENCOUNTER — Other Ambulatory Visit: Payer: Self-pay | Admitting: Medical Genetics

## 2023-11-18 DIAGNOSIS — H2512 Age-related nuclear cataract, left eye: Secondary | ICD-10-CM | POA: Diagnosis not present

## 2023-11-18 DIAGNOSIS — H2511 Age-related nuclear cataract, right eye: Secondary | ICD-10-CM | POA: Diagnosis not present

## 2023-11-21 NOTE — Telephone Encounter (Signed)
New rx pended for sign-off. PA for patient's partner pending a medical review for approval.

## 2023-11-22 MED ORDER — LANCET DEVICE MISC: 1.0000 | Freq: Three times a day (TID) | 0 refills | Status: AC

## 2023-11-22 MED ORDER — BLOOD GLUCOSE MONITORING SUPPL DEVI: 1.0000 | Freq: Three times a day (TID) | 0 refills | Status: DC

## 2023-11-22 MED ORDER — LANCETS MISC. MISC: 1.0000 | Freq: Three times a day (TID) | 99 refills | Status: AC

## 2023-11-22 MED ORDER — BLOOD GLUCOSE TEST VI STRP: 1.0000 | ORAL_STRIP | Freq: Three times a day (TID) | 99 refills | Status: AC

## 2023-11-30 ENCOUNTER — Other Ambulatory Visit (HOSPITAL_COMMUNITY)
Admission: RE | Admit: 2023-11-30 | Discharge: 2023-11-30 | Disposition: A | Discharge status: Home / Self Care | Payer: Self-pay | Source: Ambulatory Visit | Attending: Medical Genetics | Admitting: Medical Genetics

## 2023-12-09 DIAGNOSIS — H2512 Age-related nuclear cataract, left eye: Secondary | ICD-10-CM | POA: Diagnosis not present

## 2023-12-14 LAB — GENECONNECT MOLECULAR SCREEN: Genetic Analysis Overall Interpretation: NEGATIVE

## 2023-12-20 ENCOUNTER — Other Ambulatory Visit: Payer: Self-pay | Admitting: Physician Assistant

## 2023-12-20 DIAGNOSIS — I10 Essential (primary) hypertension: Secondary | ICD-10-CM

## 2024-01-20 ENCOUNTER — Ambulatory Visit: Payer: Medicaid Other | Admitting: Physician Assistant

## 2024-01-20 ENCOUNTER — Encounter: Payer: Self-pay | Admitting: Physician Assistant

## 2024-01-20 VITALS — BP 120/85 | HR 85 | Ht 64.0 in | Wt 180.0 lb

## 2024-01-20 DIAGNOSIS — J302 Other seasonal allergic rhinitis: Secondary | ICD-10-CM

## 2024-01-20 DIAGNOSIS — E6609 Other obesity due to excess calories: Secondary | ICD-10-CM | POA: Diagnosis not present

## 2024-01-20 DIAGNOSIS — Z6831 Body mass index (BMI) 31.0-31.9, adult: Secondary | ICD-10-CM | POA: Diagnosis not present

## 2024-01-20 DIAGNOSIS — I10 Essential (primary) hypertension: Secondary | ICD-10-CM

## 2024-01-20 DIAGNOSIS — E119 Type 2 diabetes mellitus without complications: Secondary | ICD-10-CM | POA: Diagnosis not present

## 2024-01-20 DIAGNOSIS — Z7984 Long term (current) use of oral hypoglycemic drugs: Secondary | ICD-10-CM

## 2024-01-20 DIAGNOSIS — E66811 Obesity, class 1: Secondary | ICD-10-CM

## 2024-01-20 DIAGNOSIS — E785 Hyperlipidemia, unspecified: Secondary | ICD-10-CM | POA: Diagnosis not present

## 2024-01-20 DIAGNOSIS — E1165 Type 2 diabetes mellitus with hyperglycemia: Secondary | ICD-10-CM | POA: Diagnosis not present

## 2024-01-20 LAB — POCT GLYCOSYLATED HEMOGLOBIN (HGB A1C): Hemoglobin A1C: 8.1 % — AB (ref 4.0–5.6)

## 2024-01-20 MED ORDER — TOUJEO SOLOSTAR 300 UNIT/ML ~~LOC~~ SOPN: 10.0000 [IU] | PEN_INJECTOR | Freq: Every evening | SUBCUTANEOUS | 2 refills | Status: DC | PRN

## 2024-01-20 MED ORDER — LORATADINE 10 MG PO TABS: 10.0000 mg | ORAL_TABLET | Freq: Every day | ORAL | 3 refills | Status: DC

## 2024-01-20 MED ORDER — LEVOCETIRIZINE DIHYDROCHLORIDE 5 MG PO TABS: 5.0000 mg | ORAL_TABLET | Freq: Every evening | ORAL | 2 refills | Status: DC

## 2024-01-20 MED ORDER — OZEMPIC (1 MG/DOSE) 4 MG/3ML ~~LOC~~ SOPN: PEN_INJECTOR | SUBCUTANEOUS | 2 refills | Status: DC

## 2024-01-20 MED ORDER — SYNJARDY 12.5-1000 MG PO TABS: 1.0000 | ORAL_TABLET | Freq: Two times a day (BID) | ORAL | 2 refills | Status: DC

## 2024-01-20 MED ORDER — FREESTYLE LIBRE 3 SENSOR MISC: 1.0000 | 3 refills | Status: DC

## 2024-01-20 NOTE — Patient Instructions (Signed)
 Toujeo 10 units at bedtime added to med list.

## 2024-01-20 NOTE — Progress Notes (Addendum)
 Established Patient Office Visit  Subjective   Patient ID: Alexandra [name], female    DOB: Nov 18, 1965  Age: 58 y.o. MRN: [id number]  Chief Complaint  Patient presents with   Medical Management of Chronic Issues    Last [name] 7.3     HPI Pt is a 58 yo female with T2DM, HTN, HLD who presents to the clinic for 3 month follow up.   She is taking her ozempic . She is not taking her sugars due to not wanting to stick her fingers. She has not done well with diet over the past 3 months. Denies any swelling, CP, palpitations, headaches or vision changes. She is not regularly exercising.   Needs refills of claritin  during allergy season. All other allergy medications make her sleepy.   Review of Systems  All other systems reviewed and are negative.     Objective:     BP 120/85   Pulse 85   Ht 5\' 4"  (1.626 m)   Wt 180 lb (81.6 kg)   LMP 09/19/2015 Comment: none since dec 2016  SpO2 99%   BMI 30.90 kg/m  BP Readings from Last 3 Encounters:  01/20/24 120/85  10/21/23 94/78  07/12/23 101/70   Wt Readings from Last 3 Encounters:  01/20/24 180 lb (81.6 kg)  10/21/23 174 lb (78.9 kg)  07/12/23 168 lb (76.2 kg)      Physical Exam Constitutional:      Appearance: Normal appearance. She is obese.  HENT:     Head: Normocephalic.  Cardiovascular:     Rate and Rhythm: Normal rate and regular rhythm.  Pulmonary:     Effort: Pulmonary effort is normal.     Breath sounds: Normal breath sounds.  Musculoskeletal:     Right lower leg: No edema.     Left lower leg: No edema.  Neurological:     General: No focal deficit present.     Mental Status: She is alert and oriented to person, place, and time.  Psychiatric:        Mood and Affect: Mood normal.      Results for orders placed or performed in visit on 01/20/24  POCT HgB [name]  Result Value Ref Range   Hemoglobin [name] 8.1 (A) 4.0 - 5.6 %   HbA1c POC (<> result, manual entry)     HbA1c, POC (prediabetic range)     HbA1c, POC  (controlled diabetic range)        The 10-year ASCVD risk score (Arnett DK, et al., 2019) is: 10.5%    Assessment & Plan:  Alexandra AasAaron AasVelvilee "Evette" was seen today for medical management of chronic issues.  Diagnoses and all orders for this visit:  Uncontrolled type 2 diabetes mellitus with hyperglycemia (HCC) -     Empagliflozin-metFORMIN  HCl (SYNJARDY ) 12.02-999 MG TABS; Take 1 tablet by mouth 2 (two) times daily. -     Semaglutide , 1 MG/DOSE, (OZEMPIC , 1 MG/DOSE,) 4 MG/3ML SOPN; INJECT 1MG  UNDER THE SKIN EVERY WEEK AS DIRECTED -     insulin  glargine, 1 Unit Dial, (TOUJEO  SOLOSTAR) 300 UNIT/ML Solostar Pen; Inject 10 Units into the skin at bedtime as needed. -     Continuous Glucose Sensor (FREESTYLE LIBRE 3 SENSOR) MISC; 1 each by Does not apply route every 14 (fourteen) days. Please apply for 14 days and then switch to new sensor  Controlled type 2 diabetes mellitus without complication, without long-term current use of insulin  (HCC) -     POCT HgB [name] -  Empagliflozin-metFORMIN  HCl (SYNJARDY ) 12.02-999 MG TABS; Take 1 tablet by mouth 2 (two) times daily. -     Semaglutide , 1 MG/DOSE, (OZEMPIC , 1 MG/DOSE,) 4 MG/3ML SOPN; INJECT 1MG  UNDER THE SKIN EVERY WEEK AS DIRECTED -     insulin  glargine, 1 Unit Dial, (TOUJEO  SOLOSTAR) 300 UNIT/ML Solostar Pen; Inject 10 Units into the skin at bedtime as needed. -     Continuous Glucose Sensor (FREESTYLE LIBRE 3 SENSOR) MISC; 1 each by Does not apply route every 14 (fourteen) days. Please apply for 14 days and then switch to new sensor  Seasonal allergies -     loratadine  (CLARITIN ) 10 MG tablet; Take 1 tablet (10 mg total) by mouth daily.  Class 1 obesity due to excess calories with serious comorbidity and body mass index (BMI) of 31.0 to 31.9 in adult  Hyperlipidemia LDL goal <70  Essential hypertension, benign  Other orders -     Discontinue: levocetirizine (XYZAL ) 5 MG tablet; Take 1 tablet (5 mg total) by mouth every evening.    [name]  not to goal increased a lot from 3 months ago Continue synjardy  for diabetes management and to get to goal of under 7.0.  Xigduo  was not previously covered by insurance Continue ozempic  1mg  weekly cannot tolerate higher dose Added toujeo  5 units at bedtime Libre sensor sent to pharmacy Discussed DM diet On statin Eye and foot exam UTd Declined vaccines Follow up in 3months   Claritin  refilled for allergies   Return in about 3 months (around 04/20/2024).    Iliyah Bui, PA-C

## 2024-01-23 ENCOUNTER — Encounter: Payer: Self-pay | Admitting: Physician Assistant

## 2024-01-24 ENCOUNTER — Telehealth: Payer: Self-pay

## 2024-01-24 ENCOUNTER — Other Ambulatory Visit (HOSPITAL_COMMUNITY): Payer: Self-pay

## 2024-01-24 NOTE — Telephone Encounter (Signed)
 Pharmacy Patient Advocate Encounter  Received notification from [facility] that Prior Authorization for Synjardy 12.02-999 has been DENIED.  No reason given; No denial letter received via Fax or CMM. It has been requested and will be uploaded to the media tab once received.   PA #/Case ID/Reference #: [id number]

## 2024-01-26 ENCOUNTER — Other Ambulatory Visit (HOSPITAL_COMMUNITY): Payer: Self-pay

## 2024-02-21 ENCOUNTER — Telehealth: Payer: Self-pay

## 2024-02-21 NOTE — Telephone Encounter (Signed)
 Patient was identified as falling into the [place] Measure - Diabetes.   Patient was: Appointment already scheduled for:  04/24/2024.

## 2024-02-29 ENCOUNTER — Other Ambulatory Visit (HOSPITAL_COMMUNITY): Payer: Self-pay

## 2024-02-29 ENCOUNTER — Telehealth: Payer: Self-pay

## 2024-02-29 NOTE — Telephone Encounter (Signed)
 Pharmacy Patient Advocate Encounter   Received notification from CoverMyMeds that prior authorization for Ozempic  (1 MG/DOSE) 4MG /3ML pen-injectors is required/requested.   Insurance verification completed.   The patient is insured through [facility] .   Per test claim: PA required; PA submitted to above mentioned insurance via CoverMyMeds Key/confirmation #/EOC [id number] Status is pending

## 2024-03-02 NOTE — Telephone Encounter (Signed)
 Per provider - please review notes from 01/20/24. Please initiate an appeal for Ozempic . Thanks in advance.

## 2024-03-02 NOTE — Telephone Encounter (Signed)
 See my 01/20/24 note. What am I missing?

## 2024-03-02 NOTE — Telephone Encounter (Signed)
 PA auth for Ozempic  denied by the insurance. Patient has been updated of the outcome via a MyChart message.

## 2024-03-02 NOTE — Telephone Encounter (Signed)
 Pharmacy Patient Advocate Encounter  Received notification from [facility] that Prior Authorization for Ozempic  (1 MG/DOSE) 4MG /3ML pen-injectors  has been DENIED.  Full denial letter will be uploaded to the media tab. See denial reason below.   PA #/Case ID/Reference #: [id number]

## 2024-03-02 NOTE — Telephone Encounter (Signed)
 Per the note from the Rx auth team:

## 2024-03-05 ENCOUNTER — Telehealth: Payer: Self-pay | Admitting: Pharmacist

## 2024-03-05 DIAGNOSIS — E1165 Type 2 diabetes mellitus with hyperglycemia: Secondary | ICD-10-CM

## 2024-03-05 DIAGNOSIS — E119 Type 2 diabetes mellitus without complications: Secondary | ICD-10-CM

## 2024-03-05 NOTE — Telephone Encounter (Signed)
 Appeal has been submitted for Ozempic . Will advise when response is received, please be advised that most companies may take 30 days to make a decision. Appeal letter and supporting documentation have been faxed to [phone] on 03/05/2024 @1 :43 pm.  Thank you, Dene Fines, PharmD Clinical Pharmacist  [place]  Direct Dial: [phone]

## 2024-03-05 NOTE — Telephone Encounter (Signed)
 Information has been sent to clinical pharmacist for appeals review. It may take 5-7 days to prepare the necessary documentation to request the appeal from the insurance.

## 2024-03-06 ENCOUNTER — Other Ambulatory Visit (HOSPITAL_COMMUNITY): Payer: Self-pay

## 2024-03-06 MED ORDER — OZEMPIC (1 MG/DOSE) 4 MG/3ML ~~LOC~~ SOPN: PEN_INJECTOR | SUBCUTANEOUS | 2 refills | Status: DC

## 2024-03-06 NOTE — Telephone Encounter (Signed)
 Patient advised.

## 2024-03-06 NOTE — Addendum Note (Signed)
 Addended by: Doretha Ganja on: 03/06/2024 01:49 PM   Modules accepted: Orders

## 2024-03-06 NOTE — Telephone Encounter (Signed)
 Per patient's insurance, the appeal for Ozempic  has been approved for one year from today.  Thank you!

## 2024-04-24 ENCOUNTER — Ambulatory Visit: Admitting: Physician Assistant

## 2024-04-25 ENCOUNTER — Telehealth: Payer: Self-pay

## 2024-04-25 NOTE — Telephone Encounter (Signed)
 Patient was identified as falling into the [place] Measure - Diabetes.   Patient was: Appointment already scheduled for:  05/28/2024.

## 2024-04-26 ENCOUNTER — Encounter: Payer: Self-pay | Admitting: Physician Assistant

## 2024-04-26 DIAGNOSIS — L304 Erythema intertrigo: Secondary | ICD-10-CM

## 2024-04-27 MED ORDER — CLOTRIMAZOLE-BETAMETHASONE 1-0.05 % EX CREA: 1.0000 | TOPICAL_CREAM | Freq: Two times a day (BID) | CUTANEOUS | 1 refills | Status: AC

## 2024-05-28 ENCOUNTER — Ambulatory Visit: Admitting: Physician Assistant

## 2024-05-28 ENCOUNTER — Encounter: Payer: Self-pay | Admitting: Physician Assistant

## 2024-05-28 VITALS — BP 107/71 | HR 69 | Ht 64.0 in | Wt 182.0 lb

## 2024-05-28 DIAGNOSIS — E6609 Other obesity due to excess calories: Secondary | ICD-10-CM

## 2024-05-28 DIAGNOSIS — E785 Hyperlipidemia, unspecified: Secondary | ICD-10-CM

## 2024-05-28 DIAGNOSIS — Z7984 Long term (current) use of oral hypoglycemic drugs: Secondary | ICD-10-CM | POA: Diagnosis not present

## 2024-05-28 DIAGNOSIS — E1165 Type 2 diabetes mellitus with hyperglycemia: Secondary | ICD-10-CM

## 2024-05-28 DIAGNOSIS — E66811 Obesity, class 1: Secondary | ICD-10-CM

## 2024-05-28 DIAGNOSIS — I10 Essential (primary) hypertension: Secondary | ICD-10-CM | POA: Diagnosis not present

## 2024-05-28 DIAGNOSIS — E119 Type 2 diabetes mellitus without complications: Secondary | ICD-10-CM

## 2024-05-28 DIAGNOSIS — Z7985 Long-term (current) use of injectable non-insulin antidiabetic drugs: Secondary | ICD-10-CM

## 2024-05-28 DIAGNOSIS — Z6831 Body mass index (BMI) 31.0-31.9, adult: Secondary | ICD-10-CM

## 2024-05-28 LAB — POCT GLYCOSYLATED HEMOGLOBIN (HGB A1C): Hemoglobin A1C: 8.3 % — AB (ref 4.0–5.6)

## 2024-05-28 MED ORDER — ATORVASTATIN CALCIUM 40 MG PO TABS
40.0000 mg | ORAL_TABLET | Freq: Every day | ORAL | 3 refills | Status: AC
Start: 2024-05-28 — End: ?

## 2024-05-28 MED ORDER — SYNJARDY 12.5-1000 MG PO TABS: 1.0000 | ORAL_TABLET | Freq: Two times a day (BID) | ORAL | 2 refills | Status: DC

## 2024-05-28 MED ORDER — OZEMPIC (1 MG/DOSE) 4 MG/3ML ~~LOC~~ SOPN: PEN_INJECTOR | SUBCUTANEOUS | 2 refills | Status: DC

## 2024-05-28 MED ORDER — FREESTYLE LIBRE 3 SENSOR MISC: 1.0000 | 3 refills | Status: AC

## 2024-05-28 NOTE — Patient Instructions (Signed)
 Wear the libre for sugar management.  Continue synjardy  and ozempic  Start toujeo  5-10 units every night until goal sugars 90-130

## 2024-05-29 ENCOUNTER — Encounter: Payer: Self-pay | Admitting: Physician Assistant

## 2024-05-29 DIAGNOSIS — Z419 Encounter for procedure for purposes other than remedying health state, unspecified: Secondary | ICD-10-CM | POA: Diagnosis not present

## 2024-05-29 NOTE — Progress Notes (Addendum)
 Established Patient Office Visit  Subjective   Patient ID: Alexandra [name], female    DOB: July 18, 1966  Age: 58 y.o. MRN: [id number]  Chief Complaint  Patient presents with   Medical Management of Chronic Issues    HPI Pt is a 58 yo obese female with T2DM, HLD, HTN who presents to the clinic for 3 month follow up. She is not checking her sugars. She is not taking toujeo  at all and not taking her 2nd dose of synjardy . She denies any hypoglycemic events. She has not been active or exercising. They have gone on a lot of vacations. She reports to be taking all other medication. She denies any CP, palpitations, headaches or vision changes. She does not like her weight and would like to lose weight.    ROS See HPI>    Objective:     BP 107/71   Pulse 69   Ht 5' 4 (1.626 m)   Wt 182 lb (82.6 kg)   LMP 09/19/2015 Comment: none since dec 2016  SpO2 99%   BMI 31.24 kg/m  BP Readings from Last 3 Encounters:  05/28/24 107/71  01/20/24 120/85  10/21/23 94/78   Wt Readings from Last 3 Encounters:  05/28/24 182 lb (82.6 kg)  01/20/24 180 lb (81.6 kg)  10/21/23 174 lb (78.9 kg)      Physical Exam Constitutional:      Appearance: Normal appearance. She is obese.  HENT:     Head: Normocephalic.  Cardiovascular:     Rate and Rhythm: Normal rate and regular rhythm.     Pulses: Normal pulses.     Heart sounds: Normal heart sounds.  Pulmonary:     Effort: Pulmonary effort is normal.     Breath sounds: Normal breath sounds.  Musculoskeletal:     Right lower leg: No edema.     Left lower leg: No edema.  Neurological:     General: No focal deficit present.     Mental Status: She is alert and oriented to person, place, and time.  Psychiatric:        Mood and Affect: Mood normal.      Results for orders placed or performed in visit on 05/28/24  POCT HgB [name]  Result Value Ref Range   Hemoglobin [name] 8.3 (A) 4.0 - 5.6 %   HbA1c POC (<> result, manual entry)     HbA1c, POC  (prediabetic range)     HbA1c, POC (controlled diabetic range)       The 10-year ASCVD risk score (Arnett DK, et al., 2019) is: 7.5%    Assessment & Plan:  [name] was seen today for medical management of chronic issues.  Diagnoses and all orders for this visit:  Uncontrolled type 2 diabetes mellitus with hyperglycemia (HCC) -     POCT HgB [name] -     Semaglutide , 1 MG/DOSE, (OZEMPIC , 1 MG/DOSE,) 4 MG/3ML SOPN; INJECT 1MG  UNDER THE SKIN EVERY WEEK AS DIRECTED -     Empagliflozin-metFORMIN  HCl (SYNJARDY ) 12.02-999 MG TABS; Take 1 tablet by mouth 2 (two) times daily. -     Continuous Glucose Sensor (FREESTYLE LIBRE 3 SENSOR) MISC; 1 each by Does not apply route every 14 (fourteen) days. Please apply for 14 days and then switch to new sensor -     CMP14+EGFR  Hyperlipidemia LDL goal <70 -     atorvastatin  (LIPITOR) 40 MG tablet; Take 1 tablet (40 mg total) by mouth daily.  Essential hypertension, benign -  CMP14+EGFR  Class 1 obesity due to excess calories with serious comorbidity and body mass index (BMI) of 31.0 to 31.9 in adult   [name] not to goal Continue max tolerated GLP-1 1mg  weekly Continue synjardy  12.02/999 and make sure taking twice a day Add toujeo  5-10 units nightly Start using libre sensor to help you see how diet effects your sugars Discussed importance of sugar control Discussed DM diet and regular exercise BP to goal, continue on lisinopril  2.5mg  On statin Cmp ordered for follow up  Eye and foot exam UTD Declined covid, flu, pneumonia vaccine today Follow up in 3 months   Return in about 3 months (around 08/28/2024).    Loyda Costin, PA-C

## 2024-05-29 NOTE — Progress Notes (Signed)
 Established Patient Office Visit  Subjective   Patient ID: Alexandra [name], female    DOB: July 18, 1966  Age: 58 y.o. MRN: [id number]  Chief Complaint  Patient presents with   Medical Management of Chronic Issues    HPI Pt is a 58 yo obese female with T2DM, HLD, HTN who presents to the clinic for 3 month follow up. She is not checking her sugars. She is not taking toujeo  at all and not taking her 2nd dose of synjardy . She denies any hypoglycemic events. She has not been active or exercising. They have gone on a lot of vacations. She reports to be taking all other medication. She denies any CP, palpitations, headaches or vision changes. She does not like her weight and would like to lose weight.    ROS See HPI>    Objective:     BP 107/71   Pulse 69   Ht 5' 4 (1.626 m)   Wt 182 lb (82.6 kg)   LMP 09/19/2015 Comment: none since dec 2016  SpO2 99%   BMI 31.24 kg/m  BP Readings from Last 3 Encounters:  05/28/24 107/71  01/20/24 120/85  10/21/23 94/78   Wt Readings from Last 3 Encounters:  05/28/24 182 lb (82.6 kg)  01/20/24 180 lb (81.6 kg)  10/21/23 174 lb (78.9 kg)      Physical Exam Constitutional:      Appearance: Normal appearance. She is obese.  HENT:     Head: Normocephalic.  Cardiovascular:     Rate and Rhythm: Normal rate and regular rhythm.     Pulses: Normal pulses.     Heart sounds: Normal heart sounds.  Pulmonary:     Effort: Pulmonary effort is normal.     Breath sounds: Normal breath sounds.  Musculoskeletal:     Right lower leg: No edema.     Left lower leg: No edema.  Neurological:     General: No focal deficit present.     Mental Status: She is alert and oriented to person, place, and time.  Psychiatric:        Mood and Affect: Mood normal.      Results for orders placed or performed in visit on 05/28/24  POCT HgB [name]  Result Value Ref Range   Hemoglobin [name] 8.3 (A) 4.0 - 5.6 %   HbA1c POC (<> result, manual entry)     HbA1c, POC  (prediabetic range)     HbA1c, POC (controlled diabetic range)       The 10-year ASCVD risk score (Arnett DK, et al., 2019) is: 7.5%    Assessment & Plan:  Alexandra [name] was seen today for medical management of chronic issues.  Diagnoses and all orders for this visit:  Uncontrolled type 2 diabetes mellitus with hyperglycemia (HCC) -     POCT HgB [name] -     Semaglutide , 1 MG/DOSE, (OZEMPIC , 1 MG/DOSE,) 4 MG/3ML SOPN; INJECT 1MG  UNDER THE SKIN EVERY WEEK AS DIRECTED -     Empagliflozin-metFORMIN  HCl (SYNJARDY ) 12.02-999 MG TABS; Take 1 tablet by mouth 2 (two) times daily. -     Continuous Glucose Sensor (FREESTYLE LIBRE 3 SENSOR) MISC; 1 each by Does not apply route every 14 (fourteen) days. Please apply for 14 days and then switch to new sensor -     CMP14+EGFR  Hyperlipidemia LDL goal <70 -     atorvastatin  (LIPITOR) 40 MG tablet; Take 1 tablet (40 mg total) by mouth daily.  Essential hypertension, benign -  CMP14+EGFR  Class 1 obesity due to excess calories with serious comorbidity and body mass index (BMI) of 31.0 to 31.9 in adult   [name] not to goal Continue max tolerated GLP-1 1mg  weekly Continue synjardy  12.02/999 and make sure taking twice a day Add toujeo  5-10 units nightly Start using libre sensor to help you see how diet effects your sugars Discussed importance of sugar control Discussed DM diet and regular exercise BP to goal, continue on lisinopril  2.5mg  On statin Cmp ordered for follow up  Eye and foot exam UTD Declined covid, flu, pneumonia vaccine today Follow up in 3 months   Return in about 3 months (around 08/28/2024).    Loyda Costin, PA-C

## 2024-05-30 ENCOUNTER — Telehealth: Payer: Self-pay

## 2024-05-30 ENCOUNTER — Other Ambulatory Visit (HOSPITAL_COMMUNITY): Payer: Self-pay

## 2024-05-30 NOTE — Telephone Encounter (Signed)
 Pharmacy Patient Advocate Encounter  Received notification from [facility] that Prior Authorization for [facility] 3 Plus sensor has been DENIED.  See denial reason below. No denial letter attached in CMM. Will attach denial letter to Media tab once received.   PA #/Case ID/Reference #: [id number]

## 2024-05-30 NOTE — Telephone Encounter (Signed)
 Pharmacy Patient Advocate Encounter   Received notification from Onbase that prior authorization for Synardy 12.5-1000mg  is required/requested.   Insurance verification completed.   The patient is insured through [facility] [place] .   Per test claim: PA required; PA submitted to above mentioned insurance via Latent Key/confirmation #/EOC [facility] Status is pending

## 2024-05-30 NOTE — Telephone Encounter (Signed)
 I am addending note from yesterday to include training on CGM but she is not on insulin . She still is not approved?

## 2024-05-30 NOTE — Telephone Encounter (Signed)
 Copied from CRM #[id number]. Topic: Clinical - Medication Prior Auth >> May 30, 2024 11:32 AM Alfonso ORN wrote: Reason for CRM: Alexandra [name] calling from  [facility]  Will be faxing over information to [phone]  Need more information for the Continuous Glucose Sensor (FREESTYLE LIBRE 3 SENSOR) MISC  If patient is still insulin  dependent If patient had training on the device Need a confirm if patient had a face to face appointment that needing continuing care for insulin  and use of the machinery   Fax [phone]

## 2024-05-30 NOTE — Telephone Encounter (Signed)
 Pharmacy Patient Advocate Encounter  Received notification from [facility] Medicaid that Prior Authorization for Synjardy  12.5-1000mg  has been APPROVED from 05/30/24 to 05/30/25   PA #/Case ID/Reference #: [id number]

## 2024-05-30 NOTE — Telephone Encounter (Signed)
 I addended note from Tuesday with CGM training.

## 2024-05-30 NOTE — Telephone Encounter (Signed)
 Patient was identified as falling into the [place] Measure - Diabetes.   Patient was: Appointment already scheduled for:  08/13/2024.

## 2024-05-30 NOTE — Telephone Encounter (Signed)
 Pharmacy Patient Advocate Encounter   Received notification from Onbase that prior authorization for [facility] 3 plus sensor is required/requested.   Insurance verification completed.   The patient is insured through [facility] [place] .   Per test claim: PA required; PA submitted to above mentioned insurance via Latent Key/confirmation #/EOC [id number] Status is pending

## 2024-05-31 ENCOUNTER — Other Ambulatory Visit: Payer: Self-pay | Admitting: Physician Assistant

## 2024-05-31 DIAGNOSIS — E1165 Type 2 diabetes mellitus with hyperglycemia: Secondary | ICD-10-CM

## 2024-05-31 NOTE — Telephone Encounter (Signed)
 A patient with medicaid must meet the criteria listed below to be able to get a CGM system approved.

## 2024-05-31 NOTE — Telephone Encounter (Signed)
 Please review TE from CPhT Laneta Sieving regarding the insurance criteria for the CGM System.

## 2024-05-31 NOTE — Telephone Encounter (Signed)
 Faxed updated paperwork to  fax # given to ATTN: Richard.

## 2024-06-13 ENCOUNTER — Telehealth: Payer: Self-pay

## 2024-06-13 NOTE — Telephone Encounter (Signed)
 Can you look into Jayson Eans and see what the difference is?

## 2024-06-13 NOTE — Telephone Encounter (Signed)
 I spoke with patient about a diabetic eye exam. She states she will schedule on today.

## 2024-06-21 ENCOUNTER — Other Ambulatory Visit: Payer: Self-pay | Admitting: Physician Assistant

## 2024-06-21 DIAGNOSIS — I10 Essential (primary) hypertension: Secondary | ICD-10-CM

## 2024-06-29 DIAGNOSIS — Z419 Encounter for procedure for purposes other than remedying health state, unspecified: Secondary | ICD-10-CM | POA: Diagnosis not present

## 2024-07-29 DIAGNOSIS — Z419 Encounter for procedure for purposes other than remedying health state, unspecified: Secondary | ICD-10-CM | POA: Diagnosis not present

## 2024-08-08 ENCOUNTER — Ambulatory Visit: Admitting: Physician Assistant

## 2024-08-13 ENCOUNTER — Ambulatory Visit: Admitting: Physician Assistant

## 2024-08-15 ENCOUNTER — Other Ambulatory Visit: Payer: Self-pay | Admitting: Physician Assistant

## 2024-08-15 DIAGNOSIS — J302 Other seasonal allergic rhinitis: Secondary | ICD-10-CM

## 2024-08-22 ENCOUNTER — Other Ambulatory Visit: Payer: Self-pay | Admitting: Physician Assistant

## 2024-08-22 DIAGNOSIS — E1165 Type 2 diabetes mellitus with hyperglycemia: Secondary | ICD-10-CM

## 2024-08-30 ENCOUNTER — Encounter: Payer: Self-pay | Admitting: Physician Assistant

## 2024-08-30 ENCOUNTER — Telehealth: Payer: Self-pay

## 2024-08-30 NOTE — Telephone Encounter (Signed)
 Left message for a return call. She is due for a mammogram unless she had it at an outside location.

## 2024-09-03 LAB — OPHTHALMOLOGY REPORT-SCANNED

## 2024-09-05 ENCOUNTER — Ambulatory Visit (INDEPENDENT_AMBULATORY_CARE_PROVIDER_SITE_OTHER): Admitting: Physician Assistant

## 2024-09-05 VITALS — BP 115/72 | HR 70 | Ht 64.0 in | Wt 185.0 lb

## 2024-09-05 DIAGNOSIS — E559 Vitamin D deficiency, unspecified: Secondary | ICD-10-CM

## 2024-09-05 DIAGNOSIS — E785 Hyperlipidemia, unspecified: Secondary | ICD-10-CM

## 2024-09-05 DIAGNOSIS — Z23 Encounter for immunization: Secondary | ICD-10-CM | POA: Diagnosis not present

## 2024-09-05 DIAGNOSIS — E119 Type 2 diabetes mellitus without complications: Secondary | ICD-10-CM | POA: Diagnosis not present

## 2024-09-05 DIAGNOSIS — I10 Essential (primary) hypertension: Secondary | ICD-10-CM | POA: Diagnosis not present

## 2024-09-05 DIAGNOSIS — E1165 Type 2 diabetes mellitus with hyperglycemia: Secondary | ICD-10-CM | POA: Diagnosis not present

## 2024-09-05 DIAGNOSIS — Z7984 Long term (current) use of oral hypoglycemic drugs: Secondary | ICD-10-CM | POA: Diagnosis not present

## 2024-09-05 LAB — POCT GLYCOSYLATED HEMOGLOBIN (HGB A1C): Hemoglobin A1C: 9.3 % — AB (ref 4.0–5.6)

## 2024-09-05 MED ORDER — SYNJARDY 12.5-1000 MG PO TABS: 1.0000 | ORAL_TABLET | Freq: Two times a day (BID) | ORAL | 2 refills | Status: DC

## 2024-09-05 MED ORDER — TOUJEO SOLOSTAR 300 UNIT/ML ~~LOC~~ SOPN: 10.0000 [IU] | PEN_INJECTOR | Freq: Every evening | SUBCUTANEOUS | 2 refills | Status: AC | PRN

## 2024-09-05 MED ORDER — TIRZEPATIDE 5 MG/0.5ML ~~LOC~~ SOAJ: 5.0000 mg | SUBCUTANEOUS | 2 refills | Status: DC

## 2024-09-05 NOTE — Patient Instructions (Signed)
 Goal BS in morning is 90-120 Make sure taking 10 units of toujeo  every evening Mounjaro  5mg  weekly Synjardy  twice a day  Diabetes Mellitus and Nutrition Learn about nutritional building blocks in different types of food, which are the most important for a person with diabetes, and how to eat a balanced, healthy diet. To view the content, go to this web address: https://pe.elsevier.com/[email or web address]  This video will expire on: 09/28/2025. If you need access to this video following this date, please reach out to the healthcare provider who assigned it to you. This information is not intended to replace advice given to you by your health care provider. Make sure you discuss any questions you have with your health care provider. Elsevier Patient Education  2024 [employer].

## 2024-09-06 ENCOUNTER — Other Ambulatory Visit (HOSPITAL_COMMUNITY): Payer: Self-pay

## 2024-09-06 ENCOUNTER — Telehealth: Payer: Self-pay

## 2024-09-06 DIAGNOSIS — Z23 Encounter for immunization: Secondary | ICD-10-CM

## 2024-09-06 DIAGNOSIS — E559 Vitamin D deficiency, unspecified: Secondary | ICD-10-CM | POA: Diagnosis not present

## 2024-09-06 LAB — CMP14+EGFR
ALT: 61 IU/L — ABNORMAL HIGH (ref 0–32)
AST: 80 IU/L — ABNORMAL HIGH (ref 0–40)
Albumin: 4.4 g/dL (ref 3.8–4.9)
Alkaline Phosphatase: 121 IU/L (ref 49–135)
BUN/Creatinine Ratio: 13 (ref 9–23)
BUN: 13 mg/dL (ref 6–24)
Bilirubin Total: 0.5 mg/dL (ref 0.0–1.2)
CO2: 21 mmol/L (ref 20–29)
Calcium: 9.6 mg/dL (ref 8.7–10.2)
Chloride: 103 mmol/L (ref 96–106)
Creatinine, Ser: 0.97 mg/dL (ref 0.57–1.00)
Globulin, Total: 3.6 g/dL (ref 1.5–4.5)
Glucose: 128 mg/dL — ABNORMAL HIGH (ref 70–99)
Potassium: 4.8 mmol/L (ref 3.5–5.2)
Sodium: 140 mmol/L (ref 134–144)
Total Protein: 8 g/dL (ref 6.0–8.5)
eGFR: 67 mL/min/1.73 (ref >59)

## 2024-09-06 LAB — VITAMIN D 25 HYDROXY (VIT D DEFICIENCY, FRACTURES): Vit D, 25-Hydroxy: 26.4 ng/mL — ABNORMAL LOW (ref 30.0–100.0)

## 2024-09-06 NOTE — Telephone Encounter (Signed)
 Pharmacy Patient Advocate Encounter  Received notification from [facility] MEDICAID that Prior Authorization for Mounjaro  5MG /0.5ML auto-injectors  has been APPROVED from 09/06/24 to 09/06/25   PA #/Case ID/Reference #: [id number]

## 2024-09-06 NOTE — Telephone Encounter (Signed)
 Pharmacy Patient Advocate Encounter   Received notification from Onbase that prior authorization for Mounjaro  5MG /0.5ML auto-injectors  is required/requested.   Insurance verification completed.   The patient is insured through [facility] MEDICAID.   Per test claim: PA required; PA submitted to above mentioned insurance via Latent Key/confirmation #/EOC [id number] Status is pending

## 2024-09-07 ENCOUNTER — Ambulatory Visit: Payer: Self-pay | Admitting: Physician Assistant

## 2024-09-07 DIAGNOSIS — R748 Abnormal levels of other serum enzymes: Secondary | ICD-10-CM

## 2024-09-07 NOTE — Progress Notes (Signed)
 Alexandra [name],   Kidney function stable.  Liver enzymes are up some. Avoid all alcohol and tylenol products and recheck in 2 weeks.   Vitamin D  low. Increase whatever you are taking by 1000 units and take with K2 and dairy for better absorption.

## 2024-09-12 ENCOUNTER — Other Ambulatory Visit: Payer: Self-pay | Admitting: Physician Assistant

## 2024-09-12 DIAGNOSIS — Z1231 Encounter for screening mammogram for malignant neoplasm of breast: Secondary | ICD-10-CM

## 2024-09-13 ENCOUNTER — Other Ambulatory Visit: Payer: Self-pay | Admitting: Physician Assistant

## 2024-09-13 DIAGNOSIS — E1165 Type 2 diabetes mellitus with hyperglycemia: Secondary | ICD-10-CM

## 2024-09-17 ENCOUNTER — Encounter: Payer: Self-pay | Admitting: Physician Assistant

## 2024-09-17 NOTE — Progress Notes (Signed)
 Established Patient Office Visit  Subjective   Patient ID: Alexandra [name], female    DOB: 1966-08-16  Age: 58 y.o. MRN: [id number]  Chief Complaint  Patient presents with   Medical Management of Chronic Issues    HPI Pt is a 58 yo female with T2DM, HTN, HTN who presents to the clinic for 3 month follow up.   She is not checking her sugars regularly. CGM was declined by insurance. She admits she is not keeping a DM diet. She is not exercising. Her and her husband just recently got off a cruise. She is taking her ozempic  weekly. She admits not always taking toujeo  or her 2nd dose of synjardy . She denies any CP, palpitations, headaches or vision changes.     ROS See HPI.    Objective:     BP 115/72   Pulse 70   Ht 5' 4 (1.626 m)   Wt 185 lb (83.9 kg)   LMP 09/19/2015 Comment: none since dec 2016  SpO2 99%   BMI 31.76 kg/m  BP Readings from Last 3 Encounters:  09/05/24 115/72  05/28/24 107/71  01/20/24 120/85   Wt Readings from Last 3 Encounters:  09/05/24 185 lb (83.9 kg)  05/28/24 182 lb (82.6 kg)  01/20/24 180 lb (81.6 kg)    .SABRA Lab Results  Component Value Date   HGBA1C 9.3 (A) 09/05/2024     Physical Exam Constitutional:      Appearance: Normal appearance. She is obese.  HENT:     Head: Normocephalic.  Cardiovascular:     Rate and Rhythm: Normal rate and regular rhythm.  Pulmonary:     Effort: Pulmonary effort is normal.     Breath sounds: Normal breath sounds.  Musculoskeletal:     Right lower leg: No edema.     Left lower leg: No edema.  Neurological:     General: No focal deficit present.     Mental Status: She is alert and oriented to person, place, and time.  Psychiatric:        Mood and Affect: Mood normal.      The 10-year ASCVD risk score (Arnett DK, et al., 2019) is: 9.2%    Assessment & Plan:  [name] was seen today for medical management of chronic issues.  Diagnoses and all orders for this visit:  Uncontrolled  type 2 diabetes mellitus with hyperglycemia (HCC) -     POCT HgB [name] -     tirzepatide  (MOUNJARO ) 5 MG/0.5ML Pen; Inject 5 mg into the skin once a week. -     insulin  glargine, 1 Unit Dial, (TOUJEO  SOLOSTAR) 300 UNIT/ML Solostar Pen; Inject 10 Units into the skin at bedtime as needed. -     Empagliflozin-metFORMIN  HCl (SYNJARDY ) 12.02-999 MG TABS; Take 1 tablet by mouth 2 (two) times daily. -     CMP14+EGFR  Essential hypertension, benign -     CMP14+EGFR  Hyperlipidemia LDL goal <70 -     CMP14+EGFR  Immunization due -     Pneumococcal conjugate vaccine 20-valent (Prevnar 20)  Vitamin D  deficiency -     VITAMIN D  25 Hydroxy (Vit-D Deficiency, Fractures)   [name] is not to goal and uncontrolled at 9.3 -Pt has not been compliant with diabetic diet and stressed importance of low carbs and sugars and regular exercise - Make sure taking synjardy  twice a day - CMP ordered to recheck kidney function - Make sure taking toujeo  at bedtime 10 units and and increasing by  2 units every 5 days until fasting sugars are 90-120 - herlene was not covered in August, maybe consider LINGO OTC - stop ozempic  and replaced with mounjaro  for better [name] control - on lipitor  - BP to goal on ACE - Flu shot UTD - Pneumonia vaccine given in office today    Return in about 3 months (around 12/06/2024).    Christian Borgerding, PA-C

## 2024-09-20 ENCOUNTER — Ambulatory Visit

## 2024-09-20 ENCOUNTER — Encounter: Payer: Self-pay | Admitting: Physician Assistant

## 2024-09-20 DIAGNOSIS — Z1231 Encounter for screening mammogram for malignant neoplasm of breast: Secondary | ICD-10-CM | POA: Diagnosis not present

## 2024-09-21 ENCOUNTER — Telehealth: Payer: Self-pay

## 2024-09-21 ENCOUNTER — Other Ambulatory Visit (HOSPITAL_COMMUNITY): Payer: Self-pay

## 2024-09-21 NOTE — Telephone Encounter (Signed)
 Copied from CRM [id number]. Topic: Clinical - Prescription Issue >> Sep 21, 2024 10:30 AM Everette C wrote: Reason for CRM: Melissa with [place] [employer] has called to request additional approval and documentation from the patient's PCP. The patient's insurance provider has concerns regarding the patient's history of compliance issues and would like confirmation of their adherence for a prescription for the [facility] 3+   Please call [phone] Case ID [id number]

## 2024-09-21 NOTE — Telephone Encounter (Signed)
 Left message for a return call

## 2024-09-21 NOTE — Telephone Encounter (Signed)
 Pharmacy Patient Advocate Encounter   Received notification from Patient Advice Request messages that prior authorization for [employer] 3 Plus sensors is required/requested.   Insurance verification completed.   The patient is insured through [facility] MEDICAID.   Per test claim: PA required; PA submitted to above mentioned insurance via Latent Key/confirmation #/EOC [id number] Status is pending

## 2024-09-24 ENCOUNTER — Other Ambulatory Visit (HOSPITAL_COMMUNITY): Payer: Self-pay

## 2024-09-26 ENCOUNTER — Other Ambulatory Visit (HOSPITAL_COMMUNITY): Payer: Self-pay

## 2024-09-26 NOTE — Telephone Encounter (Signed)
 Pharmacy Patient Advocate Encounter  Received notification from [employer] that Prior Authorization for  [facility] 3 Plus sensors  has been DENIED.  Full denial letter will be uploaded to the media tab. See denial reason below.   PA #/Case ID/Reference #: [id number]

## 2024-09-28 NOTE — Telephone Encounter (Signed)
 The prior authorization for the [facility] 3 Plus sensor has been denied by the insurance. The patient has been notified of the outcome via a MyChart message.

## 2024-10-01 ENCOUNTER — Ambulatory Visit: Payer: Self-pay | Admitting: Physician Assistant

## 2024-10-01 NOTE — Progress Notes (Signed)
 Normal mammogram. Follow up in 1 year.

## 2024-10-01 NOTE — Telephone Encounter (Signed)
 Full denial letter coming. Before they wanted you to be on more insulin  and multiple times a day to approve.

## 2024-10-01 NOTE — Telephone Encounter (Signed)
 The patient is requesting an appeal. Per the patient, she is on insulin . Thanks in advance.

## 2024-10-02 ENCOUNTER — Encounter: Payer: Self-pay | Admitting: Physician Assistant

## 2024-10-02 ENCOUNTER — Telehealth: Payer: Self-pay | Admitting: Pharmacist

## 2024-10-02 NOTE — Telephone Encounter (Signed)
 Appeal has been submitted. Will advise when response is received, please be advised that most companies may take 30 days to make a decision. Appeal letter and supporting documentation have been faxed to [phone] on 10/02/2024 @4 :09pm.  Thank you, Devere Pandy, PharmD Clinical Pharmacist  [place]  Direct Dial: [phone]

## 2024-10-03 MED ORDER — TIRZEPATIDE 7.5 MG/0.5ML ~~LOC~~ SOAJ: 7.5000 mg | SUBCUTANEOUS | 1 refills | Status: AC

## 2024-10-03 NOTE — Telephone Encounter (Signed)
 Appeal has been approved by the insurance, full letter can be found under the media tab.    Thank you, Devere Pandy, PharmD Clinical Pharmacist  [place]  Direct Dial: [phone]

## 2024-10-17 ENCOUNTER — Other Ambulatory Visit: Payer: Self-pay

## 2024-10-17 DIAGNOSIS — E1165 Type 2 diabetes mellitus with hyperglycemia: Secondary | ICD-10-CM

## 2024-10-17 MED ORDER — FREESTYLE LIBRE 3 PLUS SENSOR MISC: 3 refills | Status: AC

## 2024-10-17 NOTE — Progress Notes (Signed)
 Forwarding patient request to [employer] covering [employer]   [employer] 3 plus sensors

## 2024-10-26 ENCOUNTER — Encounter: Payer: Self-pay | Admitting: Physician Assistant

## 2024-10-26 MED ORDER — INSULIN PEN NEEDLE 31G X 6 MM MISC: 1 refills | Status: AC

## 2024-10-26 NOTE — Telephone Encounter (Signed)
 Copied from CRM #[id number]. Topic: Clinical - Prescription Issue >> Oct 26, 2024  2:06 PM Avram MATSU wrote: Reason for CRM: patient stated she always get her Solostar Pen but stated never get the needles that goes with the pen. Pt stated she has been using old screw on needles. please advise [phone]

## 2024-12-07 ENCOUNTER — Ambulatory Visit: Admitting: Physician Assistant
